# Patient Record
Sex: Female | Born: 1970
Health system: Southern US, Community
[De-identification: ages and names within clinical notes are randomized; demographics above are authoritative.]

## PROBLEM LIST (undated history)

## (undated) DIAGNOSIS — K219 Gastro-esophageal reflux disease without esophagitis: Secondary | ICD-10-CM

## (undated) DIAGNOSIS — Z5189 Encounter for other specified aftercare: Secondary | ICD-10-CM

## (undated) DIAGNOSIS — E119 Type 2 diabetes mellitus without complications: Secondary | ICD-10-CM

## (undated) DIAGNOSIS — E785 Hyperlipidemia, unspecified: Secondary | ICD-10-CM

## (undated) HISTORY — PX: ABDOMINAL HYSTERECTOMY: SHX81

## (undated) HISTORY — DX: Hyperlipidemia, unspecified: E78.5

## (undated) HISTORY — DX: Type 2 diabetes mellitus without complications: E11.9

## (undated) HISTORY — DX: Encounter for other specified aftercare: Z51.89

## (undated) HISTORY — PX: UTERINE FIBROID SURGERY: SHX826

## (undated) HISTORY — DX: Gastro-esophageal reflux disease without esophagitis: K21.9

## (undated) HISTORY — PX: COLONOSCOPY: SHX174

---

## 2018-10-19 ENCOUNTER — Observation Stay (HOSPITAL_COMMUNITY)
Admission: EM | Admit: 2018-10-19 | Discharge: 2018-10-21 | Disposition: A | Discharge status: Home / Self Care | Payer: Self-pay | Attending: General Surgery | Admitting: General Surgery

## 2018-10-19 ENCOUNTER — Encounter (HOSPITAL_COMMUNITY): Payer: Self-pay | Admitting: *Deleted

## 2018-10-19 ENCOUNTER — Other Ambulatory Visit: Payer: Self-pay

## 2018-10-19 DIAGNOSIS — K8010 Calculus of gallbladder with chronic cholecystitis without obstruction: Secondary | ICD-10-CM | POA: Insufficient documentation

## 2018-10-19 DIAGNOSIS — K8050 Calculus of bile duct without cholangitis or cholecystitis without obstruction: Secondary | ICD-10-CM

## 2018-10-19 DIAGNOSIS — Z23 Encounter for immunization: Secondary | ICD-10-CM | POA: Insufficient documentation

## 2018-10-19 DIAGNOSIS — R109 Unspecified abdominal pain: Principal | ICD-10-CM | POA: Insufficient documentation

## 2018-10-19 DIAGNOSIS — K819 Cholecystitis, unspecified: Secondary | ICD-10-CM | POA: Diagnosis present

## 2018-10-19 DIAGNOSIS — Z1159 Encounter for screening for other viral diseases: Secondary | ICD-10-CM | POA: Insufficient documentation

## 2018-10-19 LAB — CBC
HCT: 42.6 % (ref 36.0–46.0)
Hemoglobin: 14.3 g/dL (ref 12.0–15.0)
MCH: 29.6 pg (ref 26.0–34.0)
MCHC: 33.6 g/dL (ref 30.0–36.0)
MCV: 88.2 fL (ref 80.0–100.0)
Platelets: 417 10*3/uL — ABNORMAL HIGH (ref 150–400)
RBC: 4.83 MIL/uL (ref 3.87–5.11)
RDW: 13.2 % (ref 11.5–15.5)
WBC: 10.3 10*3/uL (ref 4.0–10.5)
nRBC: 0 % (ref 0.0–0.2)

## 2018-10-19 MED ORDER — SODIUM CHLORIDE 0.9% FLUSH: 3.0000 mL | Freq: Once | INTRAVENOUS | Status: DC

## 2018-10-19 NOTE — ED Triage Notes (Signed)
Pt reports having right upper abd pain since 0400 that radiates to her back. Has n/v. Denies diarrhea.

## 2018-10-20 ENCOUNTER — Observation Stay (HOSPITAL_COMMUNITY): Payer: Self-pay | Admitting: Certified Registered Nurse Anesthetist

## 2018-10-20 ENCOUNTER — Encounter (HOSPITAL_COMMUNITY)
Admission: EM | Disposition: A | Discharge status: Home / Self Care | Payer: Self-pay | Source: Home / Self Care | Attending: Emergency Medicine

## 2018-10-20 ENCOUNTER — Emergency Department (HOSPITAL_COMMUNITY): Payer: Self-pay

## 2018-10-20 ENCOUNTER — Encounter (HOSPITAL_COMMUNITY): Payer: Self-pay | Admitting: General Practice

## 2018-10-20 DIAGNOSIS — K819 Cholecystitis, unspecified: Secondary | ICD-10-CM | POA: Diagnosis present

## 2018-10-20 HISTORY — PX: CHOLECYSTECTOMY: SHX55

## 2018-10-20 LAB — URINALYSIS, ROUTINE W REFLEX MICROSCOPIC
Bilirubin Urine: NEGATIVE
Glucose, UA: NEGATIVE mg/dL
Ketones, ur: NEGATIVE mg/dL
Leukocytes,Ua: NEGATIVE
Nitrite: NEGATIVE
Protein, ur: NEGATIVE mg/dL
Specific Gravity, Urine: 1.026 (ref 1.005–1.030)
pH: 5 (ref 5.0–8.0)

## 2018-10-20 LAB — COMPREHENSIVE METABOLIC PANEL
ALT: 26 U/L (ref 0–44)
AST: 22 U/L (ref 15–41)
Albumin: 4 g/dL (ref 3.5–5.0)
Alkaline Phosphatase: 73 U/L (ref 38–126)
Anion gap: 13 (ref 5–15)
BUN: 8 mg/dL (ref 6–20)
CO2: 23 mmol/L (ref 22–32)
Calcium: 9.8 mg/dL (ref 8.9–10.3)
Chloride: 101 mmol/L (ref 98–111)
Creatinine, Ser: 0.87 mg/dL (ref 0.44–1.00)
GFR calc Af Amer: >60 mL/min (ref >60)
GFR calc non Af Amer: >60 mL/min (ref >60)
Glucose, Bld: 153 mg/dL — ABNORMAL HIGH (ref 70–99)
Potassium: 3.5 mmol/L (ref 3.5–5.1)
Sodium: 137 mmol/L (ref 135–145)
Total Bilirubin: 0.6 mg/dL (ref 0.3–1.2)
Total Protein: 7.9 g/dL (ref 6.5–8.1)

## 2018-10-20 LAB — SARS CORONAVIRUS 2 BY RT PCR (HOSPITAL ORDER, PERFORMED IN ~~LOC~~ HOSPITAL LAB): SARS Coronavirus 2: NEGATIVE

## 2018-10-20 LAB — LIPASE, BLOOD: Lipase: 31 U/L (ref 11–51)

## 2018-10-20 LAB — SURGICAL PCR SCREEN
MRSA, PCR: NEGATIVE
Staphylococcus aureus: NEGATIVE

## 2018-10-20 SURGERY — LAPAROSCOPIC CHOLECYSTECTOMY
Anesthesia: General | Site: Abdomen

## 2018-10-20 MED ORDER — LACTATED RINGERS IV SOLN
INTRAVENOUS | Status: DC
  Administered 2018-10-20: 11:00:00 via INTRAVENOUS

## 2018-10-20 MED ORDER — FENTANYL CITRATE (PF) 100 MCG/2ML IJ SOLN
50.0000 ug | Freq: Once | INTRAMUSCULAR | Status: AC
  Administered 2018-10-20: 50 ug via INTRAVENOUS
  Filled 2018-10-20: qty 2

## 2018-10-20 MED ORDER — MIDAZOLAM HCL 5 MG/5ML IJ SOLN
INTRAMUSCULAR | Status: DC | PRN
  Administered 2018-10-20: 2 mg via INTRAVENOUS

## 2018-10-20 MED ORDER — ROCURONIUM BROMIDE 10 MG/ML (PF) SYRINGE
PREFILLED_SYRINGE | INTRAVENOUS | Status: DC | PRN
  Administered 2018-10-20: 50 mg via INTRAVENOUS

## 2018-10-20 MED ORDER — LIDOCAINE 2% (20 MG/ML) 5 ML SYRINGE
INTRAMUSCULAR | Status: AC
  Filled 2018-10-20: qty 5

## 2018-10-20 MED ORDER — ROCURONIUM BROMIDE 10 MG/ML (PF) SYRINGE
PREFILLED_SYRINGE | INTRAVENOUS | Status: AC
  Filled 2018-10-20: qty 10

## 2018-10-20 MED ORDER — SUCCINYLCHOLINE CHLORIDE 20 MG/ML IJ SOLN
INTRAMUSCULAR | Status: DC | PRN
  Administered 2018-10-20: 120 mg via INTRAVENOUS

## 2018-10-20 MED ORDER — DOCUSATE SODIUM 100 MG PO CAPS
100.0000 mg | ORAL_CAPSULE | Freq: Two times a day (BID) | ORAL | Status: DC
  Administered 2018-10-20 – 2018-10-21 (×3): 100 mg via ORAL
  Filled 2018-10-20 (×3): qty 1

## 2018-10-20 MED ORDER — BUPIVACAINE-EPINEPHRINE 0.25% -1:200000 IJ SOLN
INTRAMUSCULAR | Status: DC | PRN
  Administered 2018-10-20: 30 mL

## 2018-10-20 MED ORDER — PROPOFOL 10 MG/ML IV BOLUS
INTRAVENOUS | Status: AC
  Filled 2018-10-20: qty 20

## 2018-10-20 MED ORDER — DEXAMETHASONE SODIUM PHOSPHATE 10 MG/ML IJ SOLN
INTRAMUSCULAR | Status: DC | PRN
  Administered 2018-10-20: 10 mg via INTRAVENOUS

## 2018-10-20 MED ORDER — MIDAZOLAM HCL 2 MG/2ML IJ SOLN
INTRAMUSCULAR | Status: AC
  Filled 2018-10-20: qty 2

## 2018-10-20 MED ORDER — DIPHENHYDRAMINE HCL 25 MG PO CAPS: 25.0000 mg | ORAL_CAPSULE | Freq: Four times a day (QID) | ORAL | Status: DC | PRN

## 2018-10-20 MED ORDER — LACTATED RINGERS IV BOLUS
1000.0000 mL | Freq: Once | INTRAVENOUS | Status: AC
  Administered 2018-10-20: 04:00:00 1000 mL via INTRAVENOUS

## 2018-10-20 MED ORDER — BUPIVACAINE-EPINEPHRINE (PF) 0.25% -1:200000 IJ SOLN
INTRAMUSCULAR | Status: AC
  Filled 2018-10-20: qty 30

## 2018-10-20 MED ORDER — SODIUM CHLORIDE 0.9 % IV SOLN: INTRAVENOUS | Status: DC

## 2018-10-20 MED ORDER — KETAMINE HCL 50 MG/5ML IJ SOSY
PREFILLED_SYRINGE | INTRAMUSCULAR | Status: AC
  Filled 2018-10-20: qty 5

## 2018-10-20 MED ORDER — DIPHENHYDRAMINE HCL 50 MG/ML IJ SOLN: 25.0000 mg | Freq: Four times a day (QID) | INTRAMUSCULAR | Status: DC | PRN

## 2018-10-20 MED ORDER — TRAMADOL HCL 50 MG PO TABS: 50.0000 mg | ORAL_TABLET | Freq: Four times a day (QID) | ORAL | Status: DC | PRN

## 2018-10-20 MED ORDER — FENTANYL CITRATE (PF) 250 MCG/5ML IJ SOLN
INTRAMUSCULAR | Status: DC | PRN
  Administered 2018-10-20: 100 ug via INTRAVENOUS
  Administered 2018-10-20: 50 ug via INTRAVENOUS

## 2018-10-20 MED ORDER — OXYCODONE HCL 5 MG PO TABS
5.0000 mg | ORAL_TABLET | Freq: Four times a day (QID) | ORAL | Status: DC | PRN
  Administered 2018-10-20: 5 mg via ORAL
  Filled 2018-10-20: qty 1

## 2018-10-20 MED ORDER — ENOXAPARIN SODIUM 40 MG/0.4ML ~~LOC~~ SOLN
40.0000 mg | SUBCUTANEOUS | Status: DC
  Administered 2018-10-20: 40 mg via SUBCUTANEOUS
  Filled 2018-10-20: qty 0.4

## 2018-10-20 MED ORDER — KETOROLAC TROMETHAMINE 15 MG/ML IJ SOLN
15.0000 mg | Freq: Four times a day (QID) | INTRAMUSCULAR | Status: DC | PRN
  Administered 2018-10-20: 15 mg via INTRAVENOUS
  Filled 2018-10-20: qty 1

## 2018-10-20 MED ORDER — PNEUMOCOCCAL VAC POLYVALENT 25 MCG/0.5ML IJ INJ
0.5000 mL | INJECTION | INTRAMUSCULAR | Status: AC
  Administered 2018-10-21: 0.5 mL via INTRAMUSCULAR
  Filled 2018-10-20: qty 0.5

## 2018-10-20 MED ORDER — FENTANYL CITRATE (PF) 250 MCG/5ML IJ SOLN
INTRAMUSCULAR | Status: AC
  Filled 2018-10-20: qty 5

## 2018-10-20 MED ORDER — METOPROLOL TARTRATE 5 MG/5ML IV SOLN: 5.0000 mg | Freq: Four times a day (QID) | INTRAVENOUS | Status: DC | PRN

## 2018-10-20 MED ORDER — METHOCARBAMOL 500 MG PO TABS
500.0000 mg | ORAL_TABLET | Freq: Four times a day (QID) | ORAL | Status: DC | PRN
  Administered 2018-10-20: 21:00:00 500 mg via ORAL
  Filled 2018-10-20: qty 1

## 2018-10-20 MED ORDER — ACETAMINOPHEN 500 MG PO TABS
1000.0000 mg | ORAL_TABLET | Freq: Four times a day (QID) | ORAL | Status: DC
  Administered 2018-10-20 – 2018-10-21 (×4): 1000 mg via ORAL
  Filled 2018-10-20 (×4): qty 2

## 2018-10-20 MED ORDER — SODIUM CHLORIDE 0.9 % IV SOLN
2.0000 g | INTRAVENOUS | Status: DC
  Administered 2018-10-20 – 2018-10-21 (×2): 2 g via INTRAVENOUS
  Filled 2018-10-20: qty 20
  Filled 2018-10-20: qty 2
  Filled 2018-10-20: qty 20

## 2018-10-20 MED ORDER — HYDRALAZINE HCL 20 MG/ML IJ SOLN: 10.0000 mg | INTRAMUSCULAR | Status: DC | PRN

## 2018-10-20 MED ORDER — ONDANSETRON HCL 4 MG/2ML IJ SOLN
4.0000 mg | Freq: Once | INTRAMUSCULAR | Status: AC
  Administered 2018-10-20: 4 mg via INTRAVENOUS
  Filled 2018-10-20: qty 2

## 2018-10-20 MED ORDER — LIDOCAINE 2% (20 MG/ML) 5 ML SYRINGE
INTRAMUSCULAR | Status: DC | PRN
  Administered 2018-10-20: 40 mg via INTRAVENOUS
  Administered 2018-10-20: 60 mg via INTRAVENOUS

## 2018-10-20 MED ORDER — DEXAMETHASONE SODIUM PHOSPHATE 10 MG/ML IJ SOLN
INTRAMUSCULAR | Status: AC
  Filled 2018-10-20: qty 1

## 2018-10-20 MED ORDER — ONDANSETRON HCL 4 MG/2ML IJ SOLN
INTRAMUSCULAR | Status: DC | PRN
  Administered 2018-10-20: 4 mg via INTRAVENOUS

## 2018-10-20 MED ORDER — SUGAMMADEX SODIUM 200 MG/2ML IV SOLN
INTRAVENOUS | Status: DC | PRN
  Administered 2018-10-20: 200 mg via INTRAVENOUS

## 2018-10-20 MED ORDER — IOHEXOL 300 MG/ML  SOLN
100.0000 mL | Freq: Once | INTRAMUSCULAR | Status: AC | PRN
  Administered 2018-10-20: 100 mL via INTRAVENOUS

## 2018-10-20 MED ORDER — ESMOLOL HCL 100 MG/10ML IV SOLN
INTRAVENOUS | Status: DC | PRN
  Administered 2018-10-20 (×3): 20 mg via INTRAVENOUS
  Administered 2018-10-20: 40 mg via INTRAVENOUS

## 2018-10-20 MED ORDER — ONDANSETRON HCL 4 MG/2ML IJ SOLN: 4.0000 mg | Freq: Four times a day (QID) | INTRAMUSCULAR | Status: DC | PRN

## 2018-10-20 MED ORDER — KETAMINE HCL 10 MG/ML IJ SOLN
INTRAMUSCULAR | Status: DC | PRN
  Administered 2018-10-20: 10 mg via INTRAVENOUS
  Administered 2018-10-20: 20 mg via INTRAVENOUS

## 2018-10-20 MED ORDER — PHENYLEPHRINE 40 MCG/ML (10ML) SYRINGE FOR IV PUSH (FOR BLOOD PRESSURE SUPPORT)
PREFILLED_SYRINGE | INTRAVENOUS | Status: DC | PRN
  Administered 2018-10-20 (×3): 80 ug via INTRAVENOUS

## 2018-10-20 MED ORDER — 0.9 % SODIUM CHLORIDE (POUR BTL) OPTIME
TOPICAL | Status: DC | PRN
  Administered 2018-10-20: 1000 mL

## 2018-10-20 MED ORDER — ONDANSETRON 4 MG PO TBDP: 4.0000 mg | ORAL_TABLET | Freq: Four times a day (QID) | ORAL | Status: DC | PRN

## 2018-10-20 MED ORDER — ONDANSETRON HCL 4 MG/2ML IJ SOLN
INTRAMUSCULAR | Status: AC
  Filled 2018-10-20: qty 2

## 2018-10-20 MED ORDER — PROPOFOL 10 MG/ML IV BOLUS
INTRAVENOUS | Status: DC | PRN
  Administered 2018-10-20: 150 mg via INTRAVENOUS

## 2018-10-20 MED ORDER — HYDROMORPHONE HCL 1 MG/ML IJ SOLN: 0.5000 mg | INTRAMUSCULAR | Status: DC | PRN

## 2018-10-20 SURGICAL SUPPLY — 37 items
BLADE CLIPPER SURG (BLADE) IMPLANT
CANISTER SUCT 3000ML PPV (MISCELLANEOUS) ×2 IMPLANT
CHLORAPREP W/TINT 26 (MISCELLANEOUS) ×2 IMPLANT
CLIP VESOLOCK MED LG 6/CT (CLIP) ×2 IMPLANT
CLIP VESOLOCK XL 6/CT (CLIP) IMPLANT
COVER SURGICAL LIGHT HANDLE (MISCELLANEOUS) ×2 IMPLANT
COVER WAND RF STERILE (DRAPES) ×2 IMPLANT
DERMABOND ADVANCED (GAUZE/BANDAGES/DRESSINGS) ×1
DERMABOND ADVANCED .7 DNX12 (GAUZE/BANDAGES/DRESSINGS) ×1 IMPLANT
ELECT REM PT RETURN 9FT ADLT (ELECTROSURGICAL) ×2
ELECTRODE REM PT RTRN 9FT ADLT (ELECTROSURGICAL) ×1 IMPLANT
GLOVE BIOGEL PI IND STRL 7.0 (GLOVE) ×1 IMPLANT
GLOVE BIOGEL PI INDICATOR 7.0 (GLOVE) ×1
GLOVE SURG SS PI 7.0 STRL IVOR (GLOVE) ×2 IMPLANT
GOWN STRL REUS W/ TWL LRG LVL3 (GOWN DISPOSABLE) ×3 IMPLANT
GOWN STRL REUS W/TWL LRG LVL3 (GOWN DISPOSABLE) ×3
GRASPER SUT TROCAR 14GX15 (MISCELLANEOUS) ×2 IMPLANT
KIT BASIN OR (CUSTOM PROCEDURE TRAY) ×2 IMPLANT
KIT TURNOVER KIT B (KITS) ×2 IMPLANT
NEEDLE 22X1 1/2 (OR ONLY) (NEEDLE) ×2 IMPLANT
NS IRRIG 1000ML POUR BTL (IV SOLUTION) ×2 IMPLANT
PAD ARMBOARD 7.5X6 YLW CONV (MISCELLANEOUS) ×2 IMPLANT
POUCH RETRIEVAL ECOSAC 10 (ENDOMECHANICALS) ×1 IMPLANT
POUCH RETRIEVAL ECOSAC 10MM (ENDOMECHANICALS) ×1
SCISSORS LAP 5X35 DISP (ENDOMECHANICALS) ×2 IMPLANT
SET IRRIG TUBING LAPAROSCOPIC (IRRIGATION / IRRIGATOR) ×2 IMPLANT
SET TUBE SMOKE EVAC HIGH FLOW (TUBING) ×2 IMPLANT
SLEEVE ENDOPATH XCEL 5M (ENDOMECHANICALS) ×4 IMPLANT
SPECIMEN JAR SMALL (MISCELLANEOUS) ×2 IMPLANT
SUT MNCRL AB 4-0 PS2 18 (SUTURE) ×2 IMPLANT
SUT VICRYL 0 UR6 27IN ABS (SUTURE) ×1 IMPLANT
TOWEL GREEN STERILE (TOWEL DISPOSABLE) ×2 IMPLANT
TOWEL GREEN STERILE FF (TOWEL DISPOSABLE) ×2 IMPLANT
TRAY LAPAROSCOPIC MC (CUSTOM PROCEDURE TRAY) ×2 IMPLANT
TROCAR XCEL 12X100 BLDLESS (ENDOMECHANICALS) ×2 IMPLANT
TROCAR XCEL NON-BLD 5MMX100MML (ENDOMECHANICALS) ×2 IMPLANT
WATER STERILE IRR 1000ML POUR (IV SOLUTION) ×2 IMPLANT

## 2018-10-20 NOTE — ED Notes (Signed)
ED TO INPATIENT HANDOFF REPORT  ED Nurse Name and Phone #:  Henderson BaltimoreHannie [phone]  S Name/Age/Gender Rachel FeilMelinda Meza 48 y.o. female Room/Bed: 027C/027C  Code Status   Code Status: Full Code  Home/SNF/Other Home Patient oriented to: self, place, time and situation Is this baseline? Yes   Triage Complete: Triage complete  Chief Complaint Abdominal Pain   Triage Note Pt reports having right upper abd pain since 0400 that radiates to her back. Has n/v. Denies diarrhea.   Allergies No Known Allergies  Level of Care/Admitting Diagnosis ED Disposition    ED Disposition Condition Comment   Admit  [facility] [[id number]]  Level of Care: Med-Surg [16]  Covid Evaluation: Asymptomatic Screening Protocol (No Symptoms)  Diagnosis: Cholecystitis [147829][192631]  Admitting Physician: CCS, MD [[id number]]  Attending Physician: CCS, MD [[id number]]  Bed request comments: 6N  PT Class (Do Not Modify): Observation [104]  PT Acc Code (Do Not Modify): Observation [10022]       B Medical/Surgery History History reviewed. No pertinent past medical history. History reviewed. No pertinent surgical history.   A IV Location/Drains/Wounds Patient Lines/Drains/Airways Status   Active Line/Drains/Airways    Name:   Placement date:   Placement time:   Site:   Days:   Peripheral IV 10/20/18 Left Antecubital   10/20/18    0417    Antecubital   less than 1          Intake/Output Last 24 hours  Intake/Output Summary (Last 24 hours) at 10/20/2018 0716 Last data filed at 10/20/2018 [id number] Gross per 24 hour  Intake 1000 ml  Output -  Net 1000 ml    Labs/Imaging Results for orders placed or performed during the hospital encounter of 10/19/18 (from the past 48 hour(s))  Urinalysis, Routine w reflex microscopic     Status: Abnormal   Collection Time: 10/19/18 11:20 PM  Result Value Ref Range   Color, Urine YELLOW YELLOW   APPearance CLEAR CLEAR   Specific Gravity, Urine 1.026 1.005  - 1.030   pH 5.0 5.0 - 8.0   Glucose, UA NEGATIVE NEGATIVE mg/dL   Hgb urine dipstick MODERATE (A) NEGATIVE   Bilirubin Urine NEGATIVE NEGATIVE   Ketones, ur NEGATIVE NEGATIVE mg/dL   Protein, ur NEGATIVE NEGATIVE mg/dL   Nitrite NEGATIVE NEGATIVE   Leukocytes,Ua NEGATIVE NEGATIVE   RBC / HPF 0-5 0 - 5 RBC/hpf   WBC, UA 0-5 0 - 5 WBC/hpf   Bacteria, UA RARE (A) NONE SEEN   Squamous Epithelial / LPF 0-5 0 - 5   Mucus PRESENT     Comment: Performed at [name], [address] [place], [place] [address]  Lipase, blood     Status: None   Collection Time: 10/19/18 11:31 PM  Result Value Ref Range   Lipase 31 11 - 51 U/L    Comment: Performed at [facility], [address] [place], [place] [address]  Comprehensive metabolic panel     Status: Abnormal   Collection Time: 10/19/18 11:31 PM  Result Value Ref Range   Sodium 137 135 - 145 mmol/L   Potassium 3.5 3.5 - 5.1 mmol/L   Chloride 101 98 - 111 mmol/L   CO2 23 22 - 32 mmol/L   Glucose, Bld 153 (H) 70 - 99 mg/dL   BUN 8 6 - 20 mg/dL   Creatinine, Ser 6.290.87 0.44 - 1.00 mg/dL   Calcium 9.8 8.9 - 52.810.3 mg/dL   Total Protein 7.9  6.5 - 8.1 g/dL   Albumin 4.0 3.5 - 5.0 g/dL   AST 22 15 - 41 U/L   ALT 26 0 - 44 U/L   Alkaline Phosphatase 73 38 - 126 U/L   Total Bilirubin 0.6 0.3 - 1.2 mg/dL   GFR calc non Af Amer >60 >60 mL/min   GFR calc Af Amer >60 >60 mL/min   Anion gap 13 5 - 15    Comment: Performed at [facility] [address], [place], [place] [address]  CBC     Status: Abnormal   Collection Time: 10/19/18 11:31 PM  Result Value Ref Range   WBC 10.3 4.0 - 10.5 K/uL   RBC 4.83 3.87 - 5.11 MIL/uL   Hemoglobin 14.3 12.0 - 15.0 g/dL   HCT 42.6 36.0 - 46.0 %   MCV 88.2 80.0 - 100.0 fL   MCH 29.6 26.0 - 34.0 pg   MCHC 33.6 30.0 - 36.0 g/dL   RDW 13.2 11.5 - 15.5 %   Platelets 417 (H) 150 - 400 K/uL   nRBC 0.0 0.0 - 0.2 %    Comment: Performed at [facility], [place] [address], [place],  [place] [address]   Ct Abdomen Pelvis W Contrast  Result Date: 10/20/2018 CLINICAL DATA:  Abdominal pain with gastroenteritis or colitis suspected. EXAM: CT ABDOMEN AND PELVIS WITH CONTRAST TECHNIQUE: Multidetector CT imaging of the abdomen and pelvis was performed using the standard protocol following bolus administration of intravenous contrast. CONTRAST:  135mL OMNIPAQUE IOHEXOL 300 MG/ML  SOLN COMPARISON:  Right upper quadrant ultrasound earlier today FINDINGS: Lower chest:  No contributory findings. Hepatobiliary: No focal liver abnormality.Cholelithiasis better seen by ultrasound. No superimposed inflammatory changes. Pancreas: Unremarkable. Spleen: Unremarkable. Adrenals/Urinary Tract: Negative adrenals. No hydronephrosis or ureteral stone. 5 mm right renal calculus. Unremarkable bladder. Stomach/Bowel:  No obstruction. No appendicitis. Vascular/Lymphatic: No acute vascular abnormality. No mass or adenopathy. Reproductive:Hysterectomy.  Dominant follicle on the left. Other: No ascites or pneumoperitoneum. Musculoskeletal: No acute abnormalities. IMPRESSION: 1. No acute finding. 2. Cholelithiasis and right nephrolithiasis. Electronically Signed   By: Monte Fantasia M.D.   On: 10/20/2018 04:54   US Abdomen Limited Ruq  Result Date: 10/20/2018 CLINICAL DATA:  48 year old female with right upper quadrant pain radiating to the back. Nausea vomiting. EXAM: ULTRASOUND ABDOMEN LIMITED RIGHT UPPER QUADRANT COMPARISON:  None. FINDINGS: Gallbladder: Echogenic round shadowing and nonshadowing gallstones, individually estimated up to 15 millimeters diameter. Gallbladder wall thickness remains normal. No pericholecystic fluid. No sonographic Murphy sign elicited. Common bile duct: Diameter: 3-5 millimeters, within normal limits. Liver: Echogenic liver (image 36). No intrahepatic biliary ductal dilatation. No discrete liver lesion. Portal vein is patent on color Doppler imaging with normal direction of blood flow towards  the liver. Other findings: Negative visible right kidney.  No free fluid. IMPRESSION: 1. Positive for cholelithiasis, but no evidence of acute cholecystitis or bile duct obstruction. 2. Hepatic steatosis. Electronically Signed   By: Genevie Ann M.D.   On: 10/20/2018 03:26    Pending Labs Unresulted Labs (From admission, onward)    Start     Ordered   10/20/18 9485  HIV antibody (Routine Testing)  Once,   STAT     10/20/18 0639   10/20/18 0609  SARS Coronavirus 2 (CEPHEID - Performed in [facility] hospital lab), [place]  (Asymptomatic Patients Labs)  Once,   STAT    Question:  Rule Out  Answer:  Yes   10/20/18 4627  Vitals/Pain Today's Vitals   10/20/18 0143 10/20/18 0400 10/20/18 0400 10/20/18 0528  BP: (!) 144/91  (!) 144/97   Pulse: 85  85   Resp: 16  16   Temp: 99 F (37.2 C)  98.1 F (36.7 C)   TempSrc: Oral  Oral   SpO2: 96%  100%   PainSc:  7  7  4      Isolation Precautions No active isolations  Medications Medications  sodium chloride flush (NS) 0.9 % injection 3 mL (3 mLs Intravenous Not Given 10/20/18 0416)  enoxaparin (LOVENOX) injection 40 mg (has no administration in time range)  0.9 %  sodium chloride infusion (has no administration in time range)  cefTRIAXone (ROCEPHIN) 2 g in sodium chloride 0.9 % 100 mL IVPB (2 g Intravenous New Bag/Given 10/20/18 0657)  acetaminophen (TYLENOL) tablet 1,000 mg (has no administration in time range)  ketorolac (TORADOL) 15 MG/ML injection 15 mg (has no administration in time range)  traMADol (ULTRAM) tablet 50 mg (has no administration in time range)  oxyCODONE (Oxy IR/ROXICODONE) immediate release tablet 5 mg (has no administration in time range)  HYDROmorphone (DILAUDID) injection 0.5 mg (has no administration in time range)  methocarbamol (ROBAXIN) tablet 500 mg (has no administration in time range)  diphenhydrAMINE (BENADRYL) capsule 25 mg (has no administration in time range)    Or  diphenhydrAMINE (BENADRYL)  injection 25 mg (has no administration in time range)  docusate sodium (COLACE) capsule 100 mg (has no administration in time range)  ondansetron (ZOFRAN-ODT) disintegrating tablet 4 mg (has no administration in time range)    Or  ondansetron (ZOFRAN) injection 4 mg (has no administration in time range)  metoprolol tartrate (LOPRESSOR) injection 5 mg (has no administration in time range)  hydrALAZINE (APRESOLINE) injection 10 mg (has no administration in time range)  ondansetron (ZOFRAN) injection 4 mg (4 mg Intravenous Given 10/20/18 0420)  fentaNYL (SUBLIMAZE) injection 50 mcg (50 mcg Intravenous Given 10/20/18 0419)  lactated ringers bolus 1,000 mL (0 mLs Intravenous Stopped 10/20/18 0616)  iohexol (OMNIPAQUE) 300 MG/ML solution 100 mL (100 mLs Intravenous Contrast Given 10/20/18 0427)  fentaNYL (SUBLIMAZE) injection 50 mcg (50 mcg Intravenous Given 10/20/18 [id number])    Mobility walks Low fall risk   Focused Assessments   R Recommendations: See Admitting Provider Note  Report given to:   Additional Notes:

## 2018-10-20 NOTE — Anesthesia Procedure Notes (Signed)
Procedure Name: Intubation Date/Time: 10/20/2018 12:01 PM Performed by: Milford Cage, CRNA Pre-anesthesia Checklist: Patient identified, Emergency Drugs available, Suction available and Patient being monitored Patient Re-evaluated:Patient Re-evaluated prior to induction Oxygen Delivery Method: Circle system utilized Preoxygenation: Pre-oxygenation with 100% oxygen Induction Type: IV induction Laryngoscope Size: Mac and 3 Grade View: Grade III Tube type: Oral Tube size: 7.0 mm Number of attempts: 1 Airway Equipment and Method: Stylet and Oral airway Placement Confirmation: ETT inserted through vocal cords under direct vision,  positive ETCO2 and breath sounds checked- equal and bilateral Secured at: 22 cm Tube secured with: Tape Dental Injury: Teeth and Oropharynx as per pre-operative assessment

## 2018-10-20 NOTE — H&P (Signed)
Surgical H&P  CC: abdominal pain, nausea  HPI: 48yo otherwise healthy woman presented to the ER with right upper quadrant abdominal pain. Associated nausea/emesis. Symptoms aggravated by PO intake. Began about 36h prior to presentation. Had a similar episode about 8 years ago which lasted a few days and did require ER treatment. Denies fever. Symptoms persist despite analgesic/antiemetic tx in ER.   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 No pertinent past medical history.  History reviewed. No pertinent surgical history.  History reviewed. No pertinent family history.  Social History   Socioeconomic History  . Marital status: Single    Spouse name: Not on file  . Number of children: Not on file  . Years of education: Not on file  . Highest education level: Not on file  Occupational History  . Not on file  Social Needs  . Financial resource strain: Not on file  . Food insecurity    Worry: Not on file    Inability: Not on file  . Transportation needs    Medical: Not on file    Non-medical: Not on file  Tobacco Use  . Smoking status: Never Smoker  Substance and Sexual Activity  . Alcohol use: Not on file  . Drug use: Not on file  . Sexual activity: Not on file  Lifestyle  . Physical activity    Days per week: Not on file    Minutes per session: Not on file  . Stress: Not on file  Relationships  . Social Herbalist on phone: Not on file    Gets together: Not on file    Attends religious service: Not on file    Active member of club or organization: Not on file    Attends meetings of clubs or organizations: Not on file    Relationship status: Not on file  Other Topics Concern  . Not on file  Social History Narrative  . Not on file    No current facility-administered medications on file prior to encounter.    No current outpatient medications on file prior to  encounter.    Review of Systems: a complete, 10pt review of systems was completed with pertinent positives and negatives as documented in the HPI  Physical Exam: Vitals:   10/20/18 0143 10/20/18 0400  BP: (!) 144/91 (!) 144/97  Pulse: 85 85  Resp: 16 16  Temp: 99 F (37.2 C) 98.1 F (36.7 C)  SpO2: 96% 100%   Gen: A&Ox3, no distress  Head: normocephalic, atraumatic Eyes: extraocular motions intact, anicteric.  Neck: supple without mass or thyromegaly Chest: unlabored respirations, symmetrical air entry, clear bilaterally   Cardiovascular: RRR with palpable distal pulses, no pedal edema Abdomen: soft, nondistended, tender in RUQ, no peritoneal signs. No mass or organomegaly.  Extremities: warm, without edema, no deformities  Neuro: grossly intact Psych: appropriate mood and affect, normal insight  Skin: warm and dry   CBC Latest Ref Rng & Units 10/19/2018  WBC 4.0 - 10.5 K/uL 10.3  Hemoglobin 12.0 - 15.0 g/dL 14.3  Hematocrit 36.0 - 46.0 % 42.6  Platelets 150 - 400 K/uL 417(H)    CMP Latest Ref Rng & Units 10/19/2018  Glucose 70 - 99 mg/dL 153(H)  BUN 6 - 20 mg/dL 8  Creatinine 0.44 - 1.00 mg/dL 0.87  Sodium 135 - 145 mmol/L 137  Potassium 3.5 - 5.1 mmol/L 3.5  Chloride 98 -  111 mmol/L 101  CO2 22 - 32 mmol/L 23  Calcium 8.9 - 10.3 mg/dL 9.8  Total Protein 6.5 - 8.1 g/dL 7.9  Total Bilirubin 0.3 - 1.2 mg/dL 0.6  Alkaline Phos 38 - 126 U/L 73  AST 15 - 41 U/L 22  ALT 0 - 44 U/L 26    No results found for: INR, PROTIME  Imaging: Ct Abdomen Pelvis W Contrast  Result Date: 10/20/2018 CLINICAL DATA:  Abdominal pain with gastroenteritis or colitis suspected. EXAM: CT ABDOMEN AND PELVIS WITH CONTRAST TECHNIQUE: Multidetector CT imaging of the abdomen and pelvis was performed using the standard protocol following bolus administration of intravenous contrast. CONTRAST:  100mL OMNIPAQUE IOHEXOL 300 MG/ML  SOLN COMPARISON:  Right upper quadrant ultrasound earlier today  FINDINGS: Lower chest:  No contributory findings. Hepatobiliary: No focal liver abnormality.Cholelithiasis better seen by ultrasound. No superimposed inflammatory changes. Pancreas: Unremarkable. Spleen: Unremarkable. Adrenals/Urinary Tract: Negative adrenals. No hydronephrosis or ureteral stone. 5 mm right renal calculus. Unremarkable bladder. Stomach/Bowel:  No obstruction. No appendicitis. Vascular/Lymphatic: No acute vascular abnormality. No mass or adenopathy. Reproductive:Hysterectomy.  Dominant follicle on the left. Other: No ascites or pneumoperitoneum. Musculoskeletal: No acute abnormalities. IMPRESSION: 1. No acute finding. 2. Cholelithiasis and right nephrolithiasis. Electronically Signed   By: Marnee SpringJonathon  Watts M.D.   On: 10/20/2018 04:54   [place] Abdomen Limited Ruq  Result Date: 10/20/2018 CLINICAL DATA:  48 year old female with right upper quadrant pain radiating to the back. Nausea vomiting. EXAM: ULTRASOUND ABDOMEN LIMITED RIGHT UPPER QUADRANT COMPARISON:  None. FINDINGS: Gallbladder: Echogenic round shadowing and nonshadowing gallstones, individually estimated up to 15 millimeters diameter. Gallbladder wall thickness remains normal. No pericholecystic fluid. No sonographic Murphy sign elicited. Common bile duct: Diameter: 3-5 millimeters, within normal limits. Liver: Echogenic liver (image 36). No intrahepatic biliary ductal dilatation. No discrete liver lesion. Portal vein is patent on color Doppler imaging with normal direction of blood flow towards the liver. Other findings: Negative visible right kidney.  No free fluid. IMPRESSION: 1. Positive for cholelithiasis, but no evidence of acute cholecystitis or bile duct obstruction. 2. Hepatic steatosis. Electronically Signed   By: Odessa FlemingH  Hall M.D.   On: 10/20/2018 03:26     A/P: Clinically cholecystitis given pain and nausea refractory to medical therapies. I recommend proceeding with laparoscopic cholecystectomy with possible cholangiogram. Discussed  risks of surgery including bleeding, pain, scarring, intraabdominal injury specifically to the common bile duct and sequelae, bile leak, conversion to open surgery, blood clot, pneumonia, heart attack, stroke, failure to resolve symptoms, etc/ Questions welcomed and answered. Plan to proceed to OR today with Dr. Sheliah HatchKinsinger.     Phylliss Blakeshelsea Nakyra Bourn, MD [facility], [place] Pager [phone]

## 2018-10-20 NOTE — ED Provider Notes (Signed)
Emergency Department Provider Note   I have reviewed the triage vital signs and the nursing notes.   HISTORY  Chief Complaint Abdominal Pain   HPI Rachel Meza is a 48 y.o. female who presents to the emergency department today with intermittent right upper quadrant abdominal pain.  Patient states that this is been going on for the last 36 hours.  She states after multiple meals she has had this episode with nausea and then also with vomiting most recently.  No fevers.  No history of the same.  No urinary symptoms.  No history of gallstones or kidney stones that she knows of.  No new alcohol, drugs, tobacco or other changes.   No other associated or modifying symptoms.    History reviewed. No pertinent past medical history.  There are no active problems to display for this patient.   History reviewed. No pertinent surgical history.    Allergies Patient has no known allergies.  History reviewed. No pertinent family history.  Social History Social History   Tobacco Use   Smoking status: Never Smoker  Substance Use Topics   Alcohol use: Not on file   Drug use: Not on file    Review of Systems  All other systems negative except as documented in the HPI. All pertinent positives and negatives as reviewed in the HPI. ____________________________________________   PHYSICAL EXAM:  VITAL SIGNS: ED Triage Vitals  Enc Vitals Group     BP 10/19/18 2307 (!) 159/91     Pulse Rate 10/19/18 2307 97     Resp 10/19/18 2307 18     Temp 10/19/18 2307 98.7 F (37.1 C)     Temp Source 10/19/18 2307 Oral     SpO2 10/19/18 2307 96 %    Constitutional: Alert and oriented. Well appearing and in no acute distress. Eyes: Conjunctivae are normal. PERRL. EOMI. Head: Atraumatic. Nose: No congestion/rhinnorhea. Mouth/Throat: Mucous membranes are moist.  Oropharynx non-erythematous. Neck: No stridor.  No meningeal signs.   Cardiovascular: Normal rate, regular rhythm. Good  peripheral circulation. Grossly normal heart sounds.   Respiratory: Normal respiratory effort.  No retractions. Lungs CTAB. Gastrointestinal: Soft and nontender. No distention.  Musculoskeletal: No lower extremity tenderness nor edema. No gross deformities of extremities. Neurologic:  Normal speech and language. No gross focal neurologic deficits are appreciated.  Skin:  Skin is warm, dry and intact. No rash noted.   ____________________________________________   LABS (all labs ordered are listed, but only abnormal results are displayed)  Labs Reviewed  COMPREHENSIVE METABOLIC PANEL - Abnormal; Notable for the following components:      Result Value   Glucose, Bld 153 (*)    All other components within normal limits  CBC - Abnormal; Notable for the following components:   Platelets 417 (*)    All other components within normal limits  URINALYSIS, ROUTINE W REFLEX MICROSCOPIC - Abnormal; Notable for the following components:   Hgb urine dipstick MODERATE (*)    Bacteria, UA RARE (*)    All other components within normal limits  SARS CORONAVIRUS 2 (HOSPITAL ORDER, PERFORMED IN [facility])  LIPASE, BLOOD   ____________________________________________   RADIOLOGY  Ct Abdomen Pelvis W Contrast  Result Date: 10/20/2018 CLINICAL DATA:  Abdominal pain with gastroenteritis or colitis suspected. EXAM: CT ABDOMEN AND PELVIS WITH CONTRAST TECHNIQUE: Multidetector CT imaging of the abdomen and pelvis was performed using the standard protocol following bolus administration of intravenous contrast. CONTRAST:  100mL OMNIPAQUE IOHEXOL 300 MG/ML  SOLN COMPARISON:  Right upper quadrant ultrasound earlier today FINDINGS: Lower chest:  No contributory findings. Hepatobiliary: No focal liver abnormality.Cholelithiasis better seen by ultrasound. No superimposed inflammatory changes. Pancreas: Unremarkable. Spleen: Unremarkable. Adrenals/Urinary Tract: Negative adrenals. No hydronephrosis or  ureteral stone. 5 mm right renal calculus. Unremarkable bladder. Stomach/Bowel:  No obstruction. No appendicitis. Vascular/Lymphatic: No acute vascular abnormality. No mass or adenopathy. Reproductive:Hysterectomy.  Dominant follicle on the left. Other: No ascites or pneumoperitoneum. Musculoskeletal: No acute abnormalities. IMPRESSION: 1. No acute finding. 2. Cholelithiasis and right nephrolithiasis. Electronically Signed   By: Monte Fantasia M.D.   On: 10/20/2018 04:54   US Abdomen Limited Ruq  Result Date: 10/20/2018 CLINICAL DATA:  48 year old female with right upper quadrant pain radiating to the back. Nausea vomiting. EXAM: ULTRASOUND ABDOMEN LIMITED RIGHT UPPER QUADRANT COMPARISON:  None. FINDINGS: Gallbladder: Echogenic round shadowing and nonshadowing gallstones, individually estimated up to 15 millimeters diameter. Gallbladder wall thickness remains normal. No pericholecystic fluid. No sonographic Murphy sign elicited. Common bile duct: Diameter: 3-5 millimeters, within normal limits. Liver: Echogenic liver (image 36). No intrahepatic biliary ductal dilatation. No discrete liver lesion. Portal vein is patent on color Doppler imaging with normal direction of blood flow towards the liver. Other findings: Negative visible right kidney.  No free fluid. IMPRESSION: 1. Positive for cholelithiasis, but no evidence of acute cholecystitis or bile duct obstruction. 2. Hepatic steatosis. Electronically Signed   By: Genevie Ann M.D.   On: 10/20/2018 03:26    ____________________________________________   PROCEDURES  Procedure(s) performed:   Procedures   ____________________________________________   INITIAL IMPRESSION / ASSESSMENT AND PLAN / ED COURSE  Here with likely biliary colic.  We had her pain much better but then we gave her something to eat and shortly after she started having onset of pain again.  Discussed with surgery who will see for likely cholecystectomy.  Cover test ordered.     Pertinent labs & imaging results that were available during my care of the patient were reviewed by me and considered in my medical decision making (see chart for details).  ____________________________________________  FINAL CLINICAL IMPRESSION(S) / ED DIAGNOSES  Final diagnoses:  Biliary colic     MEDICATIONS GIVEN DURING THIS VISIT:  Medications  sodium chloride flush (NS) 0.9 % injection 3 mL (3 mLs Intravenous Not Given 10/20/18 0416)  fentaNYL (SUBLIMAZE) injection 50 mcg (has no administration in time range)  ondansetron (ZOFRAN) injection 4 mg (4 mg Intravenous Given 10/20/18 0420)  fentaNYL (SUBLIMAZE) injection 50 mcg (50 mcg Intravenous Given 10/20/18 0419)  lactated ringers bolus 1,000 mL (1,000 mLs Intravenous New Bag/Given 10/20/18 0419)  iohexol (OMNIPAQUE) 300 MG/ML solution 100 mL (100 mLs Intravenous Contrast Given 10/20/18 0427)     NEW OUTPATIENT MEDICATIONS STARTED DURING THIS VISIT:  New Prescriptions   No medications on file    Note:  This note was prepared with assistance of Dragon voice recognition software. Occasional wrong-word or sound-a-like substitutions may have occurred due to the inherent limitations of voice recognition software.   Rivers Hamrick, Corene Cornea, MD 10/20/18 [phone]

## 2018-10-20 NOTE — Progress Notes (Signed)
Dr. Ola Spurr, E made aware of NPO status will delay start of case until 1130. OR desk notified.

## 2018-10-20 NOTE — Anesthesia Preprocedure Evaluation (Signed)
Anesthesia Evaluation  Patient identified by MRN, date of birth, ID band Patient awake    Reviewed: Allergy & Precautions, H&P , NPO status , Patient's Chart, lab work & pertinent test results  Airway Mallampati: II  TM Distance: >3 FB Neck ROM: Full    Dental no notable dental hx. (+) Teeth Intact, Dental Advisory Given   Pulmonary neg pulmonary ROS,    Pulmonary exam normal breath sounds clear to auscultation       Cardiovascular negative cardio ROS   Rhythm:Regular Rate:Normal     Neuro/Psych negative neurological ROS  negative psych ROS   GI/Hepatic negative GI ROS, Neg liver ROS,   Endo/Other  negative endocrine ROS  Renal/GU negative Renal ROS  negative genitourinary   Musculoskeletal   Abdominal   Peds  Hematology negative hematology ROS (+)   Anesthesia Other Findings   Reproductive/Obstetrics negative OB ROS                             Anesthesia Physical Anesthesia Plan  ASA: II  Anesthesia Plan: General   Post-op Pain Management:    Induction: Intravenous  PONV Risk Score and Plan: 4 or greater and Ondansetron, Dexamethasone and Midazolam  Airway Management Planned: Oral ETT  Additional Equipment:   Intra-op Plan:   Post-operative Plan: Extubation in OR  Informed Consent: I have reviewed the patients History and Physical, chart, labs and discussed the procedure including the risks, benefits and alternatives for the proposed anesthesia with the patient or authorized representative who has indicated his/her understanding and acceptance.     Dental advisory given  Plan Discussed with: CRNA  Anesthesia Plan Comments:         Anesthesia Quick Evaluation

## 2018-10-20 NOTE — Op Note (Signed)
PATIENT:  Rachel Meza  48 y.o. female  PRE-OPERATIVE DIAGNOSIS:  abdominal pain  POST-OPERATIVE DIAGNOSIS:  abdominal pain  PROCEDURE:  Procedure(s): LAPAROSCOPIC CHOLECYSTECTOMY   SURGEON:  Surgeon(s): Camdin Hegner, Arta Bruce, MD  ASSISTANT: Jackson Latino  ANESTHESIA:   local and general  Indications for procedure: [name] is a 48 y.o. female with symptoms of Abdominal pain and Nausea and vomiting consistent with gallbladder disease, Confirmed by Ultrasound.  Description of procedure: The patient was brought into the operative suite, placed supine. Anesthesia was administered with endotracheal tube. Patient was strapped in place and foot board was secured. All pressure points were offloaded by foam padding. The patient was prepped and draped in the usual sterile fashion.  A small incision was made to the right of the umbilicus. A 57mm trocar was inserted into the peritoneal cavity with optical entry. Pneumoperitoneum was applied with high flow low pressure. 2 43mm trocars were placed in the RUQ. A 18mm trocar was placed in the subxiphoid space. Marcaine was infused to the subxiphoid space and lateral upper right abdomen in the transversus abdominis plane. Next the patient was placed in reverse trendelenberg. The gallbladder was pink in color and distended. A catheter was used to drain the gallbladder for retraction.  The gallbladder was retracted cephalad and lateral. The peritoneum was reflected off the infundibulum working lateral to medial. The cystic duct and cystic artery were identified and further dissection revealed a critical view. The cystic duct and cystic artery were doubly clipped and ligated.   The gallbladder was removed off the liver bed with cautery. The Gallbladder was placed in a specimen bag. The gallbladder fossa was irrigated and hemostasis was applied with cautery. The gallbladder was removed via the 36mm trocar. The fascial defect was closed with interrupted 0  vicryl suture in interrupted fashion. Pneumoperitoneum was removed, all trocar were removed. All incisions were closed with 4-0 monocryl subcuticular stitch. The patient woke from anesthesia and was brought to PACU in stable condition. All counts were correct  Findings: acute cholecystitis with large stones  Specimen: gallbladder  Blood loss: 30 ml  Local anesthesia: 30 ml marcaine  Complications: none  PLAN OF CARE: Admit to inpatient   PATIENT DISPOSITION:  PACU - hemodynamically stable.  Gurney Maxin, M.D. General, Bariatric, & Minimally Invasive Surgery [facility]

## 2018-10-20 NOTE — Plan of Care (Signed)
  Problem: Clinical Measurements: Goal: Ability to maintain clinical measurements within normal limits will improve Outcome: Progressing Goal: Will remain free from infection Outcome: Progressing   Problem: Elimination: Goal: Will not experience complications related to bowel motility Outcome: Progressing Goal: Will not experience complications related to urinary retention Outcome: Progressing   Problem: Pain Managment: Goal: General experience of comfort will improve Outcome: Progressing   

## 2018-10-20 NOTE — Transfer of Care (Signed)
Immediate Anesthesia Transfer of Care Note  Patient: Rachel Meza  Procedure(s) Performed: LAPAROSCOPIC CHOLECYSTECTOMY (N/A Abdomen)  Patient Location: PACU  Anesthesia Type:General  Level of Consciousness: drowsy  Airway & Oxygen Therapy: Patient Spontanous Breathing and Patient connected to face mask oxygen  Post-op Assessment: Report given to RN and Post -op Vital signs reviewed and stable  Post vital signs: Reviewed and stable  Last Vitals:  Vitals Value Taken Time  BP 141/81 10/20/18 1310  Temp    Pulse 83 10/20/18 1311  Resp 19 10/20/18 1311  SpO2 100 % 10/20/18 1311  Vitals shown include unvalidated device data.  Last Pain:  Vitals:   10/20/18 0834  TempSrc:   PainSc: 3          Complications: No apparent anesthesia complications

## 2018-10-20 NOTE — ED Notes (Signed)
Attempted report 

## 2018-10-20 NOTE — Anesthesia Postprocedure Evaluation (Signed)
Anesthesia Post Note  Patient: Rachel Meza  Procedure(s) Performed: LAPAROSCOPIC CHOLECYSTECTOMY (N/A Abdomen)     Patient location during evaluation: PACU Anesthesia Type: General Level of consciousness: awake and alert Pain management: pain level controlled Vital Signs Assessment: post-procedure vital signs reviewed and stable Respiratory status: spontaneous breathing, nonlabored ventilation, respiratory function stable and patient connected to nasal cannula oxygen Cardiovascular status: blood pressure returned to baseline and stable Postop Assessment: no apparent nausea or vomiting Anesthetic complications: no    Last Vitals:  Vitals:   10/20/18 1326 10/20/18 1351  BP: 136/73 125/75  Pulse: 84 84  Resp: 16   Temp:  36.6 C  SpO2: 99% 100%    Last Pain:  Vitals:   10/20/18 1351  TempSrc: Oral  PainSc:                  Wanette Robison S

## 2018-10-21 ENCOUNTER — Encounter (HOSPITAL_COMMUNITY): Payer: Self-pay | Admitting: General Surgery

## 2018-10-21 LAB — HIV ANTIBODY (ROUTINE TESTING W REFLEX): HIV Screen 4th Generation wRfx: NONREACTIVE

## 2018-10-21 MED ORDER — OXYCODONE HCL 5 MG PO TABS: 5.0000 mg | ORAL_TABLET | Freq: Four times a day (QID) | ORAL | 0 refills | Status: DC | PRN

## 2018-10-21 MED ORDER — ACETAMINOPHEN 500 MG PO TABS: 500.0000 mg | ORAL_TABLET | Freq: Four times a day (QID) | ORAL | 0 refills | Status: DC | PRN

## 2018-10-21 MED ORDER — DOCUSATE SODIUM 100 MG PO CAPS: 100.0000 mg | ORAL_CAPSULE | Freq: Two times a day (BID) | ORAL | 0 refills | Status: DC

## 2018-10-21 NOTE — Discharge Instructions (Signed)
[facility] ______[facility] °LAPAROSCOPIC SURGERY: POST OP INSTRUCTIONS °Always review your discharge instruction sheet given to you by the facility where your surgery was performed. °IF YOU HAVE DISABILITY OR FAMILY LEAVE FORMS, YOU MUST BRING THEM TO THE OFFICE FOR PROCESSING.   °DO NOT GIVE THEM TO YOUR DOCTOR. ° °1. A prescription for pain medication may be given to you upon discharge.  Take your pain medication as prescribed, if needed.  If narcotic pain medicine is not needed, then you may take acetaminophen (Tylenol) or ibuprofen (Advil) as needed. °2. Take your usually prescribed medications unless otherwise directed. °3. If you need a refill on your pain medication, please contact your pharmacy.  They will contact our office to request authorization. Prescriptions will not be filled after 5pm or on week-ends. °4. You should follow a light diet the first few days after arrival home, such as soup and crackers, etc.  Be sure to include lots of fluids daily. °5. Most patients will experience some swelling and bruising in the area of the incisions.  Ice packs will help.  Swelling and bruising can take several days to resolve.  °6. It is common to experience some constipation if taking pain medication after surgery.  Increasing fluid intake and taking a stool softener (such as Colace) will usually help or prevent this problem from occurring.  A mild laxative (Milk of Magnesia or Miralax) should be taken according to package instructions if there are no bowel movements after 48 hours. °7. Unless discharge instructions indicate otherwise, you may remove your bandages 24-48 hours after surgery, and you may shower at that time.  You may have steri-strips (small skin tapes) in place directly over the incision.  These strips should be left on the skin for 7-10 days.  If your surgeon used skin glue on the incision, you may shower in 24 hours.  The glue will flake off over the next 2-3 weeks.  Any sutures or  staples will be removed at the office during your follow-up visit. °8. ACTIVITIES:  You may resume regular (light) daily activities beginning the next day--such as daily self-care, walking, climbing stairs--gradually increasing activities as tolerated.  You may have sexual intercourse when it is comfortable.  Refrain from any heavy lifting or straining until approved by your doctor. °a. You may drive when you are no longer taking prescription pain medication, you can comfortably wear a seatbelt, and you can safely maneuver your car and apply brakes. °b. RETURN TO WORK:  __________________________________________________________ °9. You should see your doctor in the office for a follow-up appointment approximately 2-3 weeks after your surgery.  Make sure that you call for this appointment within a day or two after you arrive home to insure a convenient appointment time. °10. OTHER INSTRUCTIONS: __________________________________________________________________________________________________________________________ __________________________________________________________________________________________________________________________ °WHEN TO CALL YOUR DOCTOR: °1. Fever over 101.0 °2. Inability to urinate °3. Continued bleeding from incision. °4. Increased pain, redness, or drainage from the incision. °5. Increasing abdominal pain ° °The clinic staff is available to answer your questions during regular business hours.  Please don’t hesitate to call and ask to speak to one of the nurses for clinical concerns.  If you have a medical emergency, go to the nearest emergency room or call 911.  A surgeon from [facility] is always on call at the hospital. °[address], [address], [place], [place]  [address] ? [address] [address], [place], [place]   [address] °[phone] ? [phone] ? FAX [phone] °Web site:   [email or web address] °

## 2018-10-21 NOTE — Discharge Summary (Signed)
[facility]/Trauma Discharge Summary   Patient ID: Rachel Meza MRN: [id number] DOB/AGE: 1971/03/04 48 y.o.  Admit date: 10/19/2018 Discharge date: 10/21/2018  Admitting Diagnosis: Cholecystitis   Discharge Diagnosis Patient Active Problem List   Diagnosis Date Noted  . Cholecystitis 10/20/2018    Consultants none  Imaging: Ct Abdomen Pelvis W Contrast  Result Date: 10/20/2018 CLINICAL DATA:  Abdominal pain with gastroenteritis or colitis suspected. EXAM: CT ABDOMEN AND PELVIS WITH CONTRAST TECHNIQUE: Multidetector CT imaging of the abdomen and pelvis was performed using the standard protocol following bolus administration of intravenous contrast. CONTRAST:  175mL OMNIPAQUE IOHEXOL 300 MG/ML  SOLN COMPARISON:  Right upper quadrant ultrasound earlier today FINDINGS: Lower chest:  No contributory findings. Hepatobiliary: No focal liver abnormality.Cholelithiasis better seen by ultrasound. No superimposed inflammatory changes. Pancreas: Unremarkable. Spleen: Unremarkable. Adrenals/Urinary Tract: Negative adrenals. No hydronephrosis or ureteral stone. 5 mm right renal calculus. Unremarkable bladder. Stomach/Bowel:  No obstruction. No appendicitis. Vascular/Lymphatic: No acute vascular abnormality. No mass or adenopathy. Reproductive:Hysterectomy.  Dominant follicle on the left. Other: No ascites or pneumoperitoneum. Musculoskeletal: No acute abnormalities. IMPRESSION: 1. No acute finding. 2. Cholelithiasis and right nephrolithiasis. Electronically Signed   By: Monte Fantasia M.D.   On: 10/20/2018 04:54   US Abdomen Limited Ruq  Result Date: 10/20/2018 CLINICAL DATA:  48 year old female with right upper quadrant pain radiating to the back. Nausea vomiting. EXAM: ULTRASOUND ABDOMEN LIMITED RIGHT UPPER QUADRANT COMPARISON:  None. FINDINGS: Gallbladder: Echogenic round shadowing and nonshadowing gallstones, individually estimated up to 15 millimeters diameter. Gallbladder wall  thickness remains normal. No pericholecystic fluid. No sonographic Murphy sign elicited. Common bile duct: Diameter: 3-5 millimeters, within normal limits. Liver: Echogenic liver (image 36). No intrahepatic biliary ductal dilatation. No discrete liver lesion. Portal vein is patent on color Doppler imaging with normal direction of blood flow towards the liver. Other findings: Negative visible right kidney.  No free fluid. IMPRESSION: 1. Positive for cholelithiasis, but no evidence of acute cholecystitis or bile duct obstruction. 2. Hepatic steatosis. Electronically Signed   By: Genevie Ann M.D.   On: 10/20/2018 03:26    Procedures Dr. Kieth Brightly (10/20/18) - Laparoscopic Cholecystectomy   HPI: 48yo otherwise healthy woman presented to the ER with right upper quadrant abdominal pain. Associated nausea/emesis. Symptoms aggravated by PO intake. Began about 36h prior to presentation. Had a similar episode about 8 years ago which lasted a few days and did require ER treatment. Denies fever. Symptoms persist despite analgesic/antiemetic tx in ER.  Reports history of laparoscopic partial hysterectomy Works as a Scientist, water quality at [employer] Course:  Workup showed cholecystitis.  Patient was admitted and underwent procedure listed above.  Tolerated procedure well and was transferred to the floor.  Diet was advanced as tolerated.  On POD#1, the patient was voiding well, tolerating diet, ambulating well, pain well controlled, vital signs stable, incisions c/d/i and felt stable for discharge home.  Patient will follow up as outlined below and knows to call with questions or concerns.     Patient was discharged in good condition.  The [place] Substance controlled database was reviewed prior to prescribing narcotic pain medication to this patient.  Physical Exam: General:  Alert, NAD, pleasant, cooperative Cardio: RRR, S1 & S2 normal, no murmur, rubs, gallops Resp: Effort normal, lungs CTA bilaterally,  no wheezes, rales, rhonchi Abd:  Soft, ND, normal bowel sounds, no tenderness, incisions with glue intact appear well healing without bleeding or drainage. No peritonitis  Skin: warm and dry, no rashes  noted  Allergies as of 10/21/2018   No Known Allergies     Medication List    TAKE these medications   acetaminophen 500 MG tablet Commonly known as: TYLENOL Take 1 tablet (500 mg total) by mouth every 6 (six) hours as needed.   docusate sodium 100 MG capsule Commonly known as: COLACE Take 1 capsule (100 mg total) by mouth 2 (two) times daily.   oxyCODONE 5 MG immediate release tablet Commonly known as: Oxy IR/ROXICODONE Take 1 tablet (5 mg total) by mouth every 6 (six) hours as needed for moderate pain or severe pain.        Follow-up Information     [facility]. Schedule an appointment as soon as possible for a visit.   Contact information: [address] [place] [id number] [phone]       [facility] [place] Call.   Specialty: General Surgery Why: we are working on a follow up appointment for you. Please call our office to see when your appt. Please arrive 20 min prior to complete paperwork and bring your photo ID and insurance card if you have one.  Contact information: [address] [place] [id number] [phone]          Signed: [facility] 10/21/2018, 8:50 AM Pager: [phone] Consults: [phone] Mon-Fri 7:00 am-4:30 pm Sat-Sun 7:00 am-11:30 am

## 2018-10-21 NOTE — Progress Notes (Signed)
Pt discharged home in stable after going over discharge teaching with no concerns voiced

## 2020-02-22 ENCOUNTER — Ambulatory Visit: Payer: Medicaid Other

## 2020-06-21 DIAGNOSIS — Z0000 Encounter for general adult medical examination without abnormal findings: Secondary | ICD-10-CM | POA: Diagnosis not present

## 2020-06-21 DIAGNOSIS — Z131 Encounter for screening for diabetes mellitus: Secondary | ICD-10-CM | POA: Diagnosis not present

## 2020-06-21 DIAGNOSIS — R030 Elevated blood-pressure reading, without diagnosis of hypertension: Secondary | ICD-10-CM | POA: Diagnosis not present

## 2020-06-21 DIAGNOSIS — E781 Pure hyperglyceridemia: Secondary | ICD-10-CM | POA: Diagnosis not present

## 2020-06-21 DIAGNOSIS — E785 Hyperlipidemia, unspecified: Secondary | ICD-10-CM | POA: Diagnosis not present

## 2020-06-21 DIAGNOSIS — Z713 Dietary counseling and surveillance: Secondary | ICD-10-CM | POA: Diagnosis not present

## 2020-06-21 DIAGNOSIS — Z13220 Encounter for screening for lipoid disorders: Secondary | ICD-10-CM | POA: Diagnosis not present

## 2020-06-21 DIAGNOSIS — R739 Hyperglycemia, unspecified: Secondary | ICD-10-CM | POA: Diagnosis not present

## 2020-06-21 DIAGNOSIS — Z0131 Encounter for examination of blood pressure with abnormal findings: Secondary | ICD-10-CM | POA: Diagnosis not present

## 2020-06-21 DIAGNOSIS — Z6835 Body mass index (BMI) 35.0-35.9, adult: Secondary | ICD-10-CM | POA: Diagnosis not present

## 2020-06-27 DIAGNOSIS — R739 Hyperglycemia, unspecified: Secondary | ICD-10-CM | POA: Diagnosis not present

## 2020-06-27 DIAGNOSIS — Z0000 Encounter for general adult medical examination without abnormal findings: Secondary | ICD-10-CM | POA: Diagnosis not present

## 2020-06-27 DIAGNOSIS — E781 Pure hyperglyceridemia: Secondary | ICD-10-CM | POA: Diagnosis not present

## 2020-07-28 DIAGNOSIS — L700 Acne vulgaris: Secondary | ICD-10-CM | POA: Diagnosis not present

## 2020-09-07 DIAGNOSIS — Z23 Encounter for immunization: Secondary | ICD-10-CM | POA: Diagnosis not present

## 2020-10-25 ENCOUNTER — Encounter: Payer: Self-pay | Admitting: Family

## 2020-10-25 ENCOUNTER — Telehealth (INDEPENDENT_AMBULATORY_CARE_PROVIDER_SITE_OTHER): Payer: 59 | Admitting: Family

## 2020-10-25 ENCOUNTER — Other Ambulatory Visit: Payer: Self-pay

## 2020-10-25 DIAGNOSIS — Z7689 Persons encountering health services in other specified circumstances: Secondary | ICD-10-CM

## 2020-10-25 NOTE — Progress Notes (Signed)
Pt presents for telemedicine visit to establish care °

## 2020-10-25 NOTE — Progress Notes (Signed)
Virtual Visit via Telephone Note  I connected with Rachel Meza, on 10/25/2020 at 4:33 PM by telephone due to the COVID-19 pandemic and verified that I am speaking with the correct person using two identifiers.  Due to current restrictions/limitations of in-office visits due to the COVID-19 pandemic, this scheduled clinical appointment was converted to a telehealth visit.   Consent: I discussed the limitations, risks, security and privacy concerns of performing an evaluation and management service by telephone and the availability of in person appointments. I also discussed with the patient that there may be a patient responsible charge related to this service. The patient expressed understanding and agreed to proceed.   Location of Patient: Home  Location of Provider: [facility]   Persons participating in Telemedicine visit: Avrey Flanagin Ricky Stabs, NP Margorie John, CMA   History of Present Illness: Rachel Meza is a 50 year-old female who presents to establish care. PMH significant for cholecystitis.  Current issues and/or concerns:  Would like to have annual physical exam. Had a visit at the [facility] March 2022 and told she has possible high blood pressure and diabetes. Concern for hands and shoulders hurting. Hands are ongoing pain. Shoulders are new onset. Does not prefer to take medications for the same.    History reviewed. No pertinent past medical history. No Known Allergies  Current Outpatient Medications on File Prior to Visit  Medication Sig Dispense Refill   emtricitabine-tenofovir (TRUVADA) 200-300 MG tablet Take 1 tablet by mouth daily.     No current facility-administered medications on file prior to visit.    Observations/Objective: Alert and oriented x 3. Not in acute distress. Physical examination not completed as this is a telemedicine visit.  Assessment and Plan: 1. Encounter to establish care: -  Patient presents today to establish care.  - Return for annual physical examination, labs, and health maintenance. Arrive fasting meaning having no food for at least 8 hours prior to appointment. You may have only water or black coffee. Please take scheduled medications as normal.   Follow Up Instructions: Return for annual physical exam.   Patient was given clear instructions to go to Emergency Department or return to medical center if symptoms don't improve, worsen, or new problems develop.The patient verbalized understanding.  I discussed the assessment and treatment plan with the patient. The patient was provided an opportunity to ask questions and all were answered. The patient agreed with the plan and demonstrated an understanding of the instructions.   The patient was advised to call back or seek an in-person evaluation if the symptoms worsen or if the condition fails to improve as anticipated.    I provided 10 minutes total of non-face-to-face time during this encounter.   Rema Fendt, NP  [facility] at [facility] [place], [place] [phone] 10/25/2020, 4:33 PM

## 2020-12-23 NOTE — Progress Notes (Signed)
Patient ID: Rachel Meza, female    DOB: 12-16-1970  MRN: [id number]  CC: Annual Physical Exam  Subjective: Rachel Meza is a 50 y.o. female who presents for annual physical exam.   Her concerns today include:  Reports body pain is continuing. Primarily bilateral shoulders right > left, bilateral hands, right hip, and right foot. Denies any trauma or injury.  Reports she had baseline blood work obtained in April 2022 at the local [facility] with elevations of hemoglobin A1c and cholesterol.   Patient Active Problem List   Diagnosis Date Noted   Cholecystitis 10/20/2018     Current Outpatient Medications on File Prior to Visit  Medication Sig Dispense Refill   emtricitabine-tenofovir (TRUVADA) 200-300 MG tablet Take 1 tablet by mouth daily.     No current facility-administered medications on file prior to visit.    No Known Allergies  Social History   Socioeconomic History   Marital status: Single    Spouse name: Not on file   Number of children: Not on file   Years of education: Not on file   Highest education level: Not on file  Occupational History   Not on file  Tobacco Use   Smoking status: Every Day    Packs/day: 0.50    Years: 10.00    Pack years: 5.00    Types: Cigarettes   Smokeless tobacco: Never  Vaping Use   Vaping Use: Never used  Substance and Sexual Activity   Alcohol use: Yes    Alcohol/week: 3.0 standard drinks    Types: 3 Standard drinks or equivalent per week    Comment: occassionally   Drug use: Never   Sexual activity: Not on file    Comment: Tubal ligation  Other Topics Concern   Not on file  Social History Narrative   Not on file   Social Determinants of Health   Financial Resource Strain: Not on file  Food Insecurity: Not on file  Transportation Needs: Not on file  Physical Activity: Not on file  Stress: Not on file  Social Connections: Not on file  Intimate Partner Violence: Not on file    No family history on  file.  Past Surgical History:  Procedure Laterality Date   ABDOMINAL HYSTERECTOMY     CHOLECYSTECTOMY  10/20/2018   CHOLECYSTECTOMY N/A 10/20/2018   Procedure: LAPAROSCOPIC CHOLECYSTECTOMY;  Surgeon: Mickeal Skinner, MD;  Location: [facility];  Service: General;  Laterality: N/A;    ROS: Review of Systems Negative except as stated above  PHYSICAL EXAM: BP 123/79 (BP Location: Left Arm, Patient Position: Sitting, Cuff Size: Normal)   Pulse 88   Temp 97.7 F (36.5 C)   Resp 18   Ht 5' 1.02" (1.55 m)   Wt 177 lb 9.6 oz (80.6 kg)   SpO2 97%   BMI 33.53 kg/m   Physical Exam Exam conducted with a chaperone present.  HENT:     Head: Normocephalic and atraumatic.     Right Ear: Tympanic membrane, ear canal and external ear normal.     Left Ear: Tympanic membrane, ear canal and external ear normal.  Eyes:     Extraocular Movements: Extraocular movements intact.     Conjunctiva/sclera: Conjunctivae normal.     Pupils: Pupils are equal, round, and reactive to light.  Cardiovascular:     Rate and Rhythm: Normal rate and regular rhythm.     Pulses: Normal pulses.     Heart sounds: Normal heart sounds.  Pulmonary:  Effort: Pulmonary effort is normal.     Breath sounds: Normal breath sounds.  Chest:     Comments: Exam deferred pending mammogram. No issues/concerns from patient.  Abdominal:     General: Bowel sounds are normal.     Palpations: Abdomen is soft.  Genitourinary:    General: Normal vulva.     Vagina: Normal.     Cervix: Normal.     Uterus: Normal.      Adnexa: Right adnexa normal and left adnexa normal.     Comments: Elmon Else, CMA present during exam. Musculoskeletal:        General: Normal range of motion.     Cervical back: Normal range of motion and neck supple.  Skin:    General: Skin is warm and dry.     Capillary Refill: Capillary refill takes less than 2 seconds.  Neurological:     General: No focal deficit present.     Mental Status: She  is alert and oriented to person, place, and time.  Psychiatric:        Mood and Affect: Mood normal.        Behavior: Behavior normal.    ASSESSMENT AND PLAN: 1. Annual physical exam: - Counseled on 150 minutes of exercise per week as tolerated, healthy eating (including decreased daily intake of saturated fats, cholesterol, added sugars, sodium), STI prevention, and routine healthcare maintenance.  2. Screening for metabolic disorder: - RAQ76+AUQJ to check kidney function, liver function, and electrolyte balance.  - CMP14+EGFR  3. Screening for deficiency anemia: - CBC to screen for anemia. - CBC  4. Diabetes mellitus screening: - Hemoglobin A1c to screen for pre-diabetes/diabetes. - Hemoglobin A1c  5. Screening cholesterol level: - Lipid panel to screen for high cholesterol.  - Lipid panel  6. Thyroid disorder screen: - TSH to check thyroid function.  - TSH  7. Need for hepatitis C screening test: - Hepatitis C antibody to screen for hepatitis C.  - Hepatitis C Antibody  8. Pap smear for cervical cancer screening: - Cytology - PAP for cervical cancer screening.  - Cytology - PAP([facility])  9. Routine screening for STI (sexually transmitted infection): - Cervicovaginal self-swab to screen for chlamydia, gonorrhea, trichomonas, bacterial vaginitis, and candida vaginitis. - Cervicovaginal ancillary only  10. Colon cancer screening: - Referral to Gastroenterology for colon cancer screening by colonoscopy. - Ambulatory referral to Gastroenterology  11. Encounter for screening mammogram for malignant neoplasm of breast: - Referral for breast cancer screening by mammogram.  - MM Digital Screening; Future  12. Generalized body aches: - Screening for sensitivity of possible fibromyalgia.  - ANA - Rheumatoid factor - Sedimentation Rate  13. Chronic pain of both shoulders: - Diagnostic x-ray left shoulder for further evaluation.  - Diagnostic x-ray right shoulder  for further evaluation.  - Follow-up with primary provider as scheduled.  - DG Shoulder Left; Future - DG Shoulder Right; Future  14. Chronic pain of left hand: - Diagnostic x-ray left hand for further evaluation.  - Follow-up with primary provider as scheduled.  - DG Hand Complete Left; Future  15. Chronic hand pain, right: - Diagnostic x-ray right hand for further evaluation.  - Follow-up with primary provider as scheduled.  - DG Hand Complete Right; Future   Patient was given the opportunity to ask questions.  Patient verbalized understanding of the plan and was able to repeat key elements of the plan. Patient was given clear instructions to go to Emergency Department or return to  medical center if symptoms don't improve, worsen, or new problems develop.The patient verbalized understanding.   Orders Placed This Encounter  Procedures   MM Digital Screening   DG Shoulder Left   DG Shoulder Right   DG Hand Complete Left   DG Hand Complete Right   Hepatitis C Antibody   CBC   Lipid panel   CMP14+EGFR   Hemoglobin A1c   ANA   Rheumatoid factor   Sedimentation Rate   TSH   Ambulatory referral to Gastroenterology     Requested Prescriptions    No prescriptions requested or ordered in this encounter    Return in about 1 year (around 12/27/2021) for Physical per patient preference.  Camillia Herter, NP

## 2020-12-27 ENCOUNTER — Ambulatory Visit (INDEPENDENT_AMBULATORY_CARE_PROVIDER_SITE_OTHER): Payer: 59 | Admitting: Family

## 2020-12-27 ENCOUNTER — Other Ambulatory Visit (HOSPITAL_COMMUNITY)
Admission: RE | Admit: 2020-12-27 | Discharge: 2020-12-27 | Disposition: A | Discharge status: Home / Self Care | Payer: 59 | Source: Ambulatory Visit | Attending: Family | Admitting: Family

## 2020-12-27 ENCOUNTER — Other Ambulatory Visit: Payer: Self-pay

## 2020-12-27 VITALS — BP 123/79 | HR 88 | Temp 97.7°F | Resp 18 | Ht 61.02 in | Wt 177.6 lb

## 2020-12-27 DIAGNOSIS — Z1329 Encounter for screening for other suspected endocrine disorder: Secondary | ICD-10-CM

## 2020-12-27 DIAGNOSIS — Z124 Encounter for screening for malignant neoplasm of cervix: Secondary | ICD-10-CM | POA: Diagnosis not present

## 2020-12-27 DIAGNOSIS — Z1211 Encounter for screening for malignant neoplasm of colon: Secondary | ICD-10-CM

## 2020-12-27 DIAGNOSIS — Z13228 Encounter for screening for other metabolic disorders: Secondary | ICD-10-CM | POA: Diagnosis not present

## 2020-12-27 DIAGNOSIS — N771 Vaginitis, vulvitis and vulvovaginitis in diseases classified elsewhere: Secondary | ICD-10-CM | POA: Diagnosis not present

## 2020-12-27 DIAGNOSIS — Z13220 Encounter for screening for lipoid disorders: Secondary | ICD-10-CM | POA: Diagnosis not present

## 2020-12-27 DIAGNOSIS — M25511 Pain in right shoulder: Secondary | ICD-10-CM | POA: Diagnosis not present

## 2020-12-27 DIAGNOSIS — Z0000 Encounter for general adult medical examination without abnormal findings: Secondary | ICD-10-CM | POA: Diagnosis not present

## 2020-12-27 DIAGNOSIS — Z113 Encounter for screening for infections with a predominantly sexual mode of transmission: Secondary | ICD-10-CM

## 2020-12-27 DIAGNOSIS — R52 Pain, unspecified: Secondary | ICD-10-CM | POA: Diagnosis not present

## 2020-12-27 DIAGNOSIS — Z131 Encounter for screening for diabetes mellitus: Secondary | ICD-10-CM | POA: Diagnosis not present

## 2020-12-27 DIAGNOSIS — G8929 Other chronic pain: Secondary | ICD-10-CM | POA: Diagnosis not present

## 2020-12-27 DIAGNOSIS — M25512 Pain in left shoulder: Secondary | ICD-10-CM | POA: Diagnosis not present

## 2020-12-27 DIAGNOSIS — Z130 Encounter for screening for diseases of the blood and blood-forming organs and certain disorders involving the immune mechanism: Secondary | ICD-10-CM

## 2020-12-27 DIAGNOSIS — M79642 Pain in left hand: Secondary | ICD-10-CM

## 2020-12-27 DIAGNOSIS — M79641 Pain in right hand: Secondary | ICD-10-CM | POA: Diagnosis not present

## 2020-12-27 DIAGNOSIS — Z1231 Encounter for screening mammogram for malignant neoplasm of breast: Secondary | ICD-10-CM

## 2020-12-27 DIAGNOSIS — Z118 Encounter for screening for other infectious and parasitic diseases: Secondary | ICD-10-CM | POA: Diagnosis present

## 2020-12-27 DIAGNOSIS — Z1159 Encounter for screening for other viral diseases: Secondary | ICD-10-CM | POA: Diagnosis not present

## 2020-12-27 NOTE — Patient Instructions (Signed)
Preventive Care 40-49 Years Old, Female Preventive care refers to lifestyle choices and visits with your health care provider that can promote health and wellness. This includes: A yearly physical exam. This is also called an annual wellness visit. Regular dental and eye exams. Immunizations. Screening for certain conditions. Healthy lifestyle choices, such as: Eating a healthy diet. Getting regular exercise. Not using drugs or products that contain nicotine and tobacco. Limiting alcohol use. What can I expect for my preventive care visit? Physical exam Your health care provider will check your: Height and weight. These may be used to calculate your BMI (body mass index). BMI is a measurement that tells if you are at a healthy weight. Heart rate and blood pressure. Body temperature. Skin for abnormal spots. Counseling Your health care provider may ask you questions about your: Past medical problems. Family's medical history. Alcohol, tobacco, and drug use. Emotional well-being. Home life and relationship well-being. Sexual activity. Diet, exercise, and sleep habits. Work and work environment. Access to firearms. Method of birth control. Menstrual cycle. Pregnancy history. What immunizations do I need? Vaccines are usually given at various ages, according to a schedule. Your health care provider will recommend vaccines for you based on your age, medical history, and lifestyle or other factors, such as travel or where you work. What tests do I need? Blood tests Lipid and cholesterol levels. These may be checked every 5 years, or more often if you are over 50 years old. Hepatitis C test. Hepatitis B test. Screening Lung cancer screening. You may have this screening every year starting at age 55 if you have a 30-pack-year history of smoking and currently smoke or have quit within the past 15 years. Colorectal cancer screening. All adults should have this screening starting at  age 50 and continuing until age 75. Your health care provider may recommend screening at age 45 if you are at increased risk. You will have tests every 1-10 years, depending on your results and the type of screening test. Diabetes screening. This is done by checking your blood sugar (glucose) after you have not eaten for a while (fasting). You may have this done every 1-3 years. Mammogram. This may be done every 1-2 years. Talk with your health care provider about when you should start having regular mammograms. This may depend on whether you have a family history of breast cancer. BRCA-related cancer screening. This may be done if you have a family history of breast, ovarian, tubal, or peritoneal cancers. Pelvic exam and Pap test. This may be done every 3 years starting at age 21. Starting at age 30, this may be done every 5 years if you have a Pap test in combination with an HPV test. Other tests STD (sexually transmitted disease) testing, if you are at risk. Bone density scan. This is done to screen for osteoporosis. You may have this scan if you are at high risk for osteoporosis. Talk with your health care provider about your test results, treatment options, and if necessary, the need for more tests. Follow these instructions at home: Eating and drinking  Eat a diet that includes fresh fruits and vegetables, whole grains, lean protein, and low-fat dairy products. Take vitamin and mineral supplements as recommended by your health care provider. Do not drink alcohol if: Your health care provider tells you not to drink. You are pregnant, may be pregnant, or are planning to become pregnant. If you drink alcohol: Limit how much you have to 0-1 drink a day. Be   aware of how much alcohol is in your drink. In the [place] one drink equals one 12 oz bottle of beer (355 mL), one 5 oz glass of wine (148 mL), or one 1 oz glass of hard liquor (44 mL). Lifestyle Take daily care of your teeth and  gums. Brush your teeth every morning and night with fluoride toothpaste. Floss one time each day. Stay active. Exercise for at least 30 minutes 5 or more days each week. Do not use any products that contain nicotine or tobacco, such as cigarettes, e-cigarettes, and chewing tobacco. If you need help quitting, ask your health care provider. Do not use drugs. If you are sexually active, practice safe sex. Use a condom or other form of protection to prevent STIs (sexually transmitted infections). If you do not wish to become pregnant, use a form of birth control. If you plan to become pregnant, see your health care provider for a prepregnancy visit. If told by your health care provider, take low-dose aspirin daily starting at age 63. Find healthy ways to cope with stress, such as: Meditation, yoga, or listening to music. Journaling. Talking to a trusted person. Spending time with friends and family. Safety Always wear your seat belt while driving or riding in a vehicle. Do not drive: If you have been drinking alcohol. Do not ride with someone who has been drinking. When you are tired or distracted. While texting. Wear a helmet and other protective equipment during sports activities. If you have firearms in your house, make sure you follow all gun safety procedures. What's next? Visit your health care provider once a year for an annual wellness visit. Ask your health care provider how often you should have your eyes and teeth checked. Stay up to date on all vaccines. This information is not intended to replace advice given to you by your health care provider. Make sure you discuss any questions you have with your health care provider. Document Revised: 06/02/2020 Document Reviewed: 12/04/2017 Elsevier Patient Education  2022 [employer].

## 2020-12-27 NOTE — Progress Notes (Signed)
Pt presents for annual physical exam w/pap desires STD screening, pt reports concerns of joint pain

## 2020-12-28 ENCOUNTER — Other Ambulatory Visit: Payer: Self-pay | Admitting: Family

## 2020-12-28 DIAGNOSIS — R52 Pain, unspecified: Secondary | ICD-10-CM

## 2020-12-28 DIAGNOSIS — E119 Type 2 diabetes mellitus without complications: Secondary | ICD-10-CM

## 2020-12-28 DIAGNOSIS — R700 Elevated erythrocyte sedimentation rate: Secondary | ICD-10-CM

## 2020-12-28 DIAGNOSIS — N760 Acute vaginitis: Secondary | ICD-10-CM | POA: Insufficient documentation

## 2020-12-28 DIAGNOSIS — B9689 Other specified bacterial agents as the cause of diseases classified elsewhere: Secondary | ICD-10-CM

## 2020-12-28 DIAGNOSIS — R768 Other specified abnormal immunological findings in serum: Secondary | ICD-10-CM

## 2020-12-28 LAB — ANA: Anti Nuclear Antibody (ANA): POSITIVE — AB

## 2020-12-28 LAB — CERVICOVAGINAL ANCILLARY ONLY
Bacterial Vaginitis (gardnerella): POSITIVE — AB
Candida Glabrata: NEGATIVE
Candida Vaginitis: NEGATIVE
Chlamydia: NEGATIVE
Comment: NEGATIVE
Comment: NEGATIVE
Comment: NEGATIVE
Comment: NEGATIVE
Comment: NEGATIVE
Comment: NORMAL
Neisseria Gonorrhea: NEGATIVE
Trichomonas: NEGATIVE

## 2020-12-28 LAB — CMP14+EGFR
ALT: 26 IU/L (ref 0–32)
AST: 19 IU/L (ref 0–40)
Albumin/Globulin Ratio: 1.6 (ref 1.2–2.2)
Albumin: 4.6 g/dL (ref 3.8–4.8)
Alkaline Phosphatase: 120 IU/L (ref 44–121)
BUN/Creatinine Ratio: 21 (ref 9–23)
BUN: 13 mg/dL (ref 6–24)
Bilirubin Total: 0.3 mg/dL (ref 0.0–1.2)
CO2: 20 mmol/L (ref 20–29)
Calcium: 9.5 mg/dL (ref 8.7–10.2)
Chloride: 105 mmol/L (ref 96–106)
Creatinine, Ser: 0.63 mg/dL (ref 0.57–1.00)
Globulin, Total: 2.8 g/dL (ref 1.5–4.5)
Glucose: 104 mg/dL — ABNORMAL HIGH (ref 65–99)
Potassium: 4 mmol/L (ref 3.5–5.2)
Sodium: 139 mmol/L (ref 134–144)
Total Protein: 7.4 g/dL (ref 6.0–8.5)
eGFR: 109 mL/min/{1.73_m2} (ref >59)

## 2020-12-28 LAB — CBC
Hematocrit: 39.7 % (ref 34.0–46.6)
Hemoglobin: 13.5 g/dL (ref 11.1–15.9)
MCH: 29.1 pg (ref 26.6–33.0)
MCHC: 34 g/dL (ref 31.5–35.7)
MCV: 86 fL (ref 79–97)
Platelets: 325 10*3/uL (ref 150–450)
RBC: 4.64 x10E6/uL (ref 3.77–5.28)
RDW: 13.1 % (ref 11.7–15.4)
WBC: 7.3 10*3/uL (ref 3.4–10.8)

## 2020-12-28 LAB — LIPID PANEL
Chol/HDL Ratio: 4.3 ratio (ref 0.0–4.4)
Cholesterol, Total: 300 mg/dL — ABNORMAL HIGH (ref 100–199)
HDL: 69 mg/dL (ref >39)
LDL Chol Calc (NIH): 214 mg/dL — ABNORMAL HIGH (ref 0–99)
Triglycerides: 99 mg/dL (ref 0–149)
VLDL Cholesterol Cal: 17 mg/dL (ref 5–40)

## 2020-12-28 LAB — RHEUMATOID FACTOR: Rheumatoid fact SerPl-aCnc: 70.9 IU/mL — ABNORMAL HIGH (ref <14.0)

## 2020-12-28 LAB — HEMOGLOBIN A1C
Est. average glucose Bld gHb Est-mCnc: 148 mg/dL
Hgb A1c MFr Bld: 6.8 % — ABNORMAL HIGH (ref 4.8–5.6)

## 2020-12-28 LAB — SEDIMENTATION RATE: Sed Rate: 56 mm/hr — ABNORMAL HIGH (ref 0–32)

## 2020-12-28 LAB — HEPATITIS C ANTIBODY: Hep C Virus Ab: <0.1 s/co ratio (ref 0.0–0.9)

## 2020-12-28 MED ORDER — METRONIDAZOLE 500 MG PO TABS: 500.0000 mg | ORAL_TABLET | Freq: Two times a day (BID) | ORAL | 0 refills | Status: AC

## 2020-12-28 MED ORDER — METFORMIN HCL 500 MG PO TABS: 500.0000 mg | ORAL_TABLET | Freq: Two times a day (BID) | ORAL | 0 refills | Status: DC

## 2020-12-28 NOTE — Progress Notes (Signed)
Please call patient with update.   Kidney function normal.   Liver function normal.   No anemia.   Hepatitis C negative.   Sedimentation Rate, Rheumatoid Factor, and ANA all higher than normal. This may be related to fibromyalgia. A referral to Rheumatology placed. Their office should call patient within 2 weeks with appointment details.   Hemoglobin A1c is consistent with diabetes. Practice healthy eating habits of fresh fruit and vegetables, lean baked meats such as chicken, fish, and [place]; limit breads, rice, pastas, and desserts; practice regular aerobic exercise (at least 150 minutes a week as tolerated).  Begin Metformin as prescribed for diabetes. Please schedule a follow-up appointment in 4 weeks with primary provider.   Cholesterol higher than expected. High cholesterol may increase risk of heart attack and/or stroke. Consider eating more fruits, vegetables, and lean baked meats such as chicken or fish. Moderate intensity exercise at least 150 minutes as tolerated per week may help as well. However, your risk of heart attack/stroke in ten years is average risk so does not need to start a medication at the moment. Encouraged to recheck cholesterol at lab only visit in 3 months.  Gonorrhea, Chlamydia, Trichomonas, and Candida Vaginitis (sometimes called yeast infection) negative.   Positive for Bacterial Vaginitis, an overgrowth of normal bacteria in the vagina. Prescribed Metronidazole (Flagyl) twice per day for 7 days. Do not drink alcohol while taking this medication.   The following is for provider reference only: The 10-year ASCVD risk score (Arnett DK, et al., 2019) is: 3%   Values used to calculate the score:     Age: 50 years     Sex: Female     Is Non-Hispanic African American: Yes     Diabetic: No     Tobacco smoker: Yes     Systolic Blood Pressure: 123 mmHg     Is BP treated: No     HDL Cholesterol: 69 mg/dL     Total Cholesterol: 300 mg/dL

## 2020-12-29 LAB — CYTOLOGY - PAP
Adequacy: ABSENT
Comment: NEGATIVE
Diagnosis: NEGATIVE
Diagnosis: REACTIVE
High risk HPV: NEGATIVE

## 2020-12-29 NOTE — Progress Notes (Signed)
Please call patient with update.   PAP negative for lesions and malignancy. Repeat PAP in 3 years.   HPV negative.

## 2021-01-02 ENCOUNTER — Encounter: Payer: Self-pay | Admitting: Gastroenterology

## 2021-01-15 ENCOUNTER — Other Ambulatory Visit: Payer: Self-pay

## 2021-01-15 ENCOUNTER — Ambulatory Visit (AMBULATORY_SURGERY_CENTER): Payer: 59 | Admitting: *Deleted

## 2021-01-15 VITALS — Ht 61.0 in | Wt 178.0 lb

## 2021-01-15 DIAGNOSIS — Z1211 Encounter for screening for malignant neoplasm of colon: Secondary | ICD-10-CM

## 2021-01-15 MED ORDER — PLENVU 140 G PO SOLR: 1.0000 | ORAL | 0 refills | Status: DC

## 2021-01-15 NOTE — Progress Notes (Signed)
Pt verified name, DOB, address and insurance during PV today.  Pt mailed instruction packet of Emmi video, copy of consent form to read and not return, and instructions. Plenvu  coupon mailed in packet. PV completed over the phone.  Pt encouraged to call with questions or issues.  My Chart instructions to pt as well    No egg or soy allergy known to patient  No issues known to pt with past sedation with any surgeries or procedures Patient denies ever being told they had issues or difficulty with intubation  No FH of Malignant Hyperthermia Pt is not on diet pills Pt is not on  home 02  Pt is not on blood thinners  Pt denies issues with constipation  No A fib or A flutter  Pt is fully vaccinated  for Covid   Plenvu  Coupon given to pt in PV today , Code to Pharmacy and  NO PA's for preps discussed with pt In PV today  Discussed with pt there will be an out-of-pocket cost for prep and that varies from $0 to 70 +  dollars - pt verbalized understanding   Due to the COVID-19 pandemic we are asking patients to follow certain guidelines in PV and the [facility]   Pt aware of COVID protocols and [facility] guidelines   

## 2021-01-17 ENCOUNTER — Encounter: Payer: Self-pay | Admitting: Gastroenterology

## 2021-01-30 ENCOUNTER — Other Ambulatory Visit: Payer: Self-pay

## 2021-01-30 ENCOUNTER — Ambulatory Visit (AMBULATORY_SURGERY_CENTER): Payer: 59 | Admitting: Gastroenterology

## 2021-01-30 ENCOUNTER — Encounter: Payer: Self-pay | Admitting: Gastroenterology

## 2021-01-30 VITALS — BP 113/70 | HR 75 | Temp 98.4°F | Resp 14 | Ht 61.0 in | Wt 178.0 lb

## 2021-01-30 DIAGNOSIS — Z1211 Encounter for screening for malignant neoplasm of colon: Secondary | ICD-10-CM | POA: Diagnosis not present

## 2021-01-30 DIAGNOSIS — E785 Hyperlipidemia, unspecified: Secondary | ICD-10-CM | POA: Diagnosis not present

## 2021-01-30 DIAGNOSIS — E119 Type 2 diabetes mellitus without complications: Secondary | ICD-10-CM | POA: Diagnosis not present

## 2021-01-30 MED ORDER — SODIUM CHLORIDE 0.9 % IV SOLN: 500.0000 mL | Freq: Once | INTRAVENOUS | Status: DC

## 2021-01-30 NOTE — Progress Notes (Signed)
History and Physical:  This patient presents for endoscopic testing for: Encounter Diagnosis  Name Primary?   Special screening for malignant neoplasms, colon Yes    Patient denies chronic GI symptoms  ROS: Patient denies chest pain or cough   Past Medical History: Past Medical History:  Diagnosis Date   Blood transfusion without reported diagnosis    Diabetes mellitus without complication (HCC)    GERD (gastroesophageal reflux disease)    Hyperlipidemia      Past Surgical History: Past Surgical History:  Procedure Laterality Date   ABDOMINAL HYSTERECTOMY     CHOLECYSTECTOMY N/A 10/20/2018   Procedure: LAPAROSCOPIC CHOLECYSTECTOMY;  Surgeon: Kinsinger, De Blanch, MD;  Location: [facility];  Service: General;  Laterality: N/A;   COLONOSCOPY     UTERINE FIBROID SURGERY      Allergies: No Known Allergies  Outpatient Meds: Current Outpatient Medications  Medication Sig Dispense Refill   emtricitabine-tenofovir (TRUVADA) 200-300 MG tablet Take 1 tablet by mouth daily.     metFORMIN (GLUCOPHAGE) 500 MG tablet Take 1 tablet (500 mg total) by mouth 2 (two) times daily with a meal. 120 tablet 0   Current Facility-Administered Medications  Medication Dose Route Frequency Provider Last Rate Last Admin   0.9 %  sodium chloride infusion  500 mL Intravenous Once Sherrilyn Rist, MD          ___________________________________________________________________ Objective   Exam:  BP 126/81   Pulse 86   Temp 98.4 F (36.9 C) (Temporal)   Ht 5\' 1"  (1.549 m)   Wt 178 lb (80.7 kg)   SpO2 99%   BMI 33.63 kg/m   CV: RRR without murmur, S1/S2 Resp: clear to auscultation bilaterally, normal RR and effort noted GI: soft, no tenderness, with active bowel sounds.   Assessment: Encounter Diagnosis  Name Primary?   Special screening for malignant neoplasms, colon Yes     Plan: Colonoscopy  The benefits and risks of the planned procedure were described in detail with the  patient or (when appropriate) their health care proxy.  Risks were outlined as including, but not limited to, bleeding, infection, perforation, adverse medication reaction leading to cardiac or pulmonary decompensation, pancreatitis (if ERCP).  The limitation of incomplete mucosal visualization was also discussed.  No guarantees or warranties were given.    The patient is appropriate for an endoscopic procedure in the ambulatory setting.   - , MD

## 2021-01-30 NOTE — Op Note (Signed)
[facility] Patient Name: Rachel Meza Procedure Date: 01/30/2021 9:29 AM MRN: [id number] Endoscopist: Sherilyn Cooter L. Myrtie Neither , MD Age: 50 Referring MD:  Date of Birth: 01-17-1971 Gender: Female Account #: [id number] Procedure:                Colonoscopy Indications:              Screening for colorectal malignant neoplasm, This                            is the patient's first colonoscopy Medicines:                Monitored Anesthesia Care Procedure:                Pre-Anesthesia Assessment:                           - Prior to the procedure, a History and Physical                            was performed, and patient medications and                            allergies were reviewed. The patient's tolerance of                            previous anesthesia was also reviewed. The risks                            and benefits of the procedure and the sedation                            options and risks were discussed with the patient.                            All questions were answered, and informed consent                            was obtained. Prior Anticoagulants: The patient has                            taken no previous anticoagulant or antiplatelet                            agents. ASA Grade Assessment: II - A patient with                            mild systemic disease. After reviewing the risks                            and benefits, the patient was deemed in                            satisfactory condition to undergo the procedure.  After obtaining informed consent, the colonoscope                            was passed under direct vision. Throughout the                            procedure, the patient's blood pressure, pulse, and                            oxygen saturations were monitored continuously. The                            CF [id number] #[id number] was introduced through the anus                            and advanced to the the  cecum, identified by                            appendiceal orifice and ileocecal valve. The                            colonoscopy was performed without difficulty. The                            patient tolerated the procedure well. The quality                            of the bowel preparation was excellent. The                            ileocecal valve, appendiceal orifice, and rectum                            were photographed. Scope In: 9:36:18 AM Scope Out: 9:47:20 AM Scope Withdrawal Time: 0 hours 8 minutes 6 seconds  Total Procedure Duration: 0 hours 11 minutes 2 seconds  Findings:                 The perianal and digital rectal examinations were                            normal.                           The entire examined colon appeared normal on direct                            and retroflexion views. Complications:            No immediate complications. Estimated Blood Loss:     Estimated blood loss: none. Impression:               - The entire examined colon is normal on direct and                            retroflexion views.                           -  No specimens collected. Recommendation:           - Patient has a contact number available for                            emergencies. The signs and symptoms of potential                            delayed complications were discussed with the                            patient. Return to normal activities tomorrow.                            Written discharge instructions were provided to the                            patient.                           - Resume previous diet.                           - Continue present medications.                           - Repeat colonoscopy in 10 years for screening                            purposes. Rachel Meza L. Myrtie Neither, MD 01/30/2021 9:52:55 AM This report has been signed electronically.

## 2021-01-30 NOTE — Progress Notes (Signed)
[facility]  Pt's states no medical or surgical changes since previsit or office visit.  

## 2021-01-30 NOTE — Patient Instructions (Signed)
YOU HAD AN ENDOSCOPIC PROCEDURE TODAY AT THE [facility]:   Refer to the procedure report that was given to you for any specific questions about what was found during the examination.  If the procedure report does not answer your questions, please call your gastroenterologist to clarify.  If you requested that your care partner not be given the details of your procedure findings, then the procedure report has been included in a sealed envelope for you to review at your convenience later. ° °YOU SHOULD EXPECT: Some feelings of bloating in the abdomen. Passage of more gas than usual.  Walking can help get rid of the air that was put into your GI tract during the procedure and reduce the bloating. If you had a lower endoscopy (such as a colonoscopy or flexible sigmoidoscopy) you may notice spotting of blood in your stool or on the toilet paper. If you underwent a bowel prep for your procedure, you may not have a normal bowel movement for a few days. ° °Please Note:  You might notice some irritation and congestion in your nose or some drainage.  This is from the oxygen used during your procedure.  There is no need for concern and it should clear up in a day or so. ° °SYMPTOMS TO REPORT IMMEDIATELY: ° °Following lower endoscopy (colonoscopy or flexible sigmoidoscopy): ° Excessive amounts of blood in the stool ° Significant tenderness or worsening of abdominal pains ° Swelling of the abdomen that is new, acute ° Fever of 100°F or higher ° °For urgent or emergent issues, a gastroenterologist can be reached at any hour by calling [phone]. °Do not use MyChart messaging for urgent concerns.  ° ° °DIET:  We do recommend a small meal at first, but then you may proceed to your regular diet.  Drink plenty of fluids but you should avoid alcoholic beverages for 24 hours. ° °ACTIVITY:  You should plan to take it easy for the rest of today and you should NOT DRIVE or use heavy machinery until tomorrow (because of  the sedation medicines used during the test).   ° °FOLLOW UP: °Our staff will call the number listed on your records 48-72 hours following your procedure to check on you and address any questions or concerns that you may have regarding the information given to you following your procedure. If we do not reach you, we will leave a message.  We will attempt to reach you two times.  During this call, we will ask if you have developed any symptoms of COVID 19. If you develop any symptoms (ie: fever, flu-like symptoms, shortness of breath, cough etc.) before then, please call [phone].  If you test positive for Covid 19 in the 2 weeks post procedure, please call and report this information to us.   ° °SIGNATURES/CONFIDENTIALITY: °You and/or your care partner have signed paperwork which will be entered into your electronic medical record.  These signatures attest to the fact that that the information above on your After Visit Summary has been reviewed and is understood.  Full responsibility of the confidentiality of this discharge information lies with you and/or your care-partner.  °

## 2021-01-30 NOTE — Progress Notes (Signed)
Pt Drowsy. VSS. To PACU, report to RN. No anesthetic complications noted.  

## 2021-02-01 ENCOUNTER — Telehealth: Payer: Self-pay

## 2021-02-01 ENCOUNTER — Telehealth: Payer: Self-pay | Admitting: *Deleted

## 2021-02-01 NOTE — Progress Notes (Signed)
Office Visit Note  Patient: Rachel Meza             Date of Birth: 1970/12/31           MRN: [id number]             PCP: Camillia Herter, NP Referring: Camillia Herter, NP Visit Date: 02/02/2021 Occupation: [employer]  Subjective:  New Patient (Initial Visit) (Bil shoulder pain, bil foot pain, bil knee pain, bil hand pain, abnormal labs)   History of Present Illness: Rachel Meza is a 50 y.o. female here for evaluation of generalized body pains with abnormal labs including positive ANA, positive RF, and elevated sedimentation rate. She started having pain and numbness symptoms in her bilateral hands since around 2008 with no specific onset or associated events that she can recall. This occasionally worsened with pain radiating up the arm and wrist as well. She has noticed increase in pain in her shoulders and knees during the past few years also gradual onset and progression. She experiences some swelling in her feet and toes but not elsewhere at involved areas. The hands often also feel numb sometimes throughout sometimes limited to specific fingers. She occasionally loses her grip spontaneously but has normal grip strength much of the time.  She has not noticed any significant discoloration. She does not take any medications for these problems she is not sure what would be useful. She has no past major joint injury or surgery.  Labs reviewed 12/2020 ANA pos RF 70.9 ESR 56 CBC wnl CMP wnl Hgb A1c 6.8% HCV neg BV pos   Activities of Daily Living:  Patient reports morning stiffness for 12 hours.   Patient Reports nocturnal pain.  Difficulty dressing/grooming: Denies Difficulty climbing stairs: Reports Difficulty getting out of chair: Denies Difficulty using hands for taps, buttons, cutlery, and/or writing: Reports  Review of Systems  Constitutional:  Positive for fatigue.  HENT:  Positive for mouth dryness.   Eyes:  Positive for dryness.  Respiratory:  Positive for shortness of  breath.   Cardiovascular:  Positive for swelling in legs/feet.  Gastrointestinal:  Positive for diarrhea.  Endocrine: Positive for cold intolerance, heat intolerance and increased urination.  Genitourinary:  Negative for difficulty urinating.  Musculoskeletal:  Positive for joint pain, gait problem, joint pain, joint swelling, muscle weakness, morning stiffness and muscle tenderness.  Skin:  Positive for rash.  Allergic/Immunologic: Negative for susceptible to infections.  Neurological:  Positive for numbness and weakness.  Hematological:  Negative for bruising/bleeding tendency.  Psychiatric/Behavioral:  Positive for sleep disturbance.    PMFS History:  Patient Active Problem List   Diagnosis Date Noted   Bilateral hand pain 02/02/2021   Positive ANA (antinuclear antibody) 02/02/2021   Diabetes ([place]) 12/28/2020   Bacterial vaginitis 12/28/2020   Cholecystitis 10/20/2018    Past Medical History:  Diagnosis Date   Blood transfusion without reported diagnosis    Diabetes mellitus without complication ([place])    GERD (gastroesophageal reflux disease)    Hyperlipidemia     Family History  Problem Relation Age of Onset   Colon cancer Neg Hx    Colon polyps Neg Hx    Esophageal cancer Neg Hx    Rectal cancer Neg Hx    Stomach cancer Neg Hx    Past Surgical History:  Procedure Laterality Date   ABDOMINAL HYSTERECTOMY     CHOLECYSTECTOMY N/A 10/20/2018   Procedure: LAPAROSCOPIC CHOLECYSTECTOMY;  Surgeon: Mickeal Skinner, MD;  Location: [facility];  Service: General;  Laterality: N/A;   COLONOSCOPY     UTERINE FIBROID SURGERY     Social History   Social History Narrative   Not on file   Immunization History  Administered Date(s) Administered   Influenza-Unspecified 11/03/2020   Pneumococcal Polysaccharide-23 10/21/2018     Objective: Vital Signs: BP 125/79 (BP Location: Right Arm, Patient Position: Sitting, Cuff Size: Normal)   Pulse (!) 103   Resp 14   Ht _0   (1.549 m)   Wt 180 lb (81.6 kg)   BMI 34.01 kg/m    Physical Exam HENT:     Right Ear: External ear normal.     Left Ear: External ear normal.     Mouth/Throat:     Mouth: Mucous membranes are moist.     Pharynx: Oropharynx is clear.  Eyes:     Conjunctiva/sclera: Conjunctivae normal.  Cardiovascular:     Rate and Rhythm: Regular rhythm. Tachycardia present.  Pulmonary:     Effort: Pulmonary effort is normal.     Breath sounds: Normal breath sounds.  Musculoskeletal:     Right lower leg: No edema.     Left lower leg: No edema.  Skin:    General: Skin is warm and dry.     Findings: No rash.  Neurological:     General: No focal deficit present.     Mental Status: She is alert.     Deep Tendon Reflexes: Reflexes normal.     Comments: Normal grip strength Negative tinel's sign  Psychiatric:        Mood and Affect: Mood normal.     Musculoskeletal Exam:  Neck full ROM no tenderness Shoulders full ROM, anterior right shoulder pain with abduction and  Elbows full ROM no tenderness or swelling Wrists full ROM no tenderness or swelling Fingers full ROM no tenderness or swelling Knees full ROM no tenderness or swelling Ankles full ROM no tenderness or swelling 1st MTP joints bony enlargement decreased extension ROM, callus present on left great toe    Investigation: No additional findings.  Imaging: No results found.  Recent Labs: Lab Results  Component Value Date   WBC 7.3 12/27/2020   HGB 13.5 12/27/2020   PLT 325 12/27/2020   NA 139 12/27/2020   K 4.0 12/27/2020   CL 105 12/27/2020   CO2 20 12/27/2020   GLUCOSE 104 (H) 12/27/2020   BUN 13 12/27/2020   CREATININE 0.63 12/27/2020   BILITOT 0.3 12/27/2020   ALKPHOS 120 12/27/2020   AST 19 12/27/2020   ALT 26 12/27/2020   PROT 7.4 12/27/2020   ALBUMIN 4.6 12/27/2020   CALCIUM 9.5 12/27/2020   GFRAA >60 10/19/2018    Speciality Comments: No specialty comments available.  Procedures:  No procedures  performed Allergies: Patient has no known allergies.   Assessment / Plan:     Visit Diagnoses: Positive ANA (antinuclear antibody) - Plan: C-reactive protein, Anti-scleroderma antibody, RNP Antibody, Anti-Smith antibody, Sjogrens syndrome-A extractable nuclear antibody, Anti-DNA antibody, double-stranded  Positive ANA and positive rheumatoid factor and sedimentation rate does not have specific clinical criteria of autoimmune disease at this time.  We will check CRP we will also check specific antibody panel as detailed above.  My initial suspicion for systemic connective tissue disease such as lupus is low.  With a positive rheumatoid factor could potentially indicate some type of overlap problem.  Bilateral hand pain  Physical exam is completely nonspecific today.  There does not appear to be severe osteoarthritis of the hands.  Do not see any active synovitis.  Could also have possible carpal tunnel syndrome causing pain and numbness in bilateral hands.  Orders: Orders Placed This Encounter  Procedures   C-reactive protein   Anti-scleroderma antibody   RNP Antibody   Anti-Smith antibody   Sjogrens syndrome-A extractable nuclear antibody   Anti-DNA antibody, double-stranded    No orders of the defined types were placed in this encounter.    Follow-Up Instructions: Return in about 2 weeks (around 02/16/2021) for New pt +ANA/+RF/ ?CTS f/u.   Collier Salina, MD  Note - This record has been created using [employer].  Chart creation errors have been sought, but may not always  have been located. Such creation errors do not reflect on  the standard of medical care.

## 2021-02-01 NOTE — Telephone Encounter (Signed)
Called [phone]  and left a message we tried to reach pt for a follow up call. maw

## 2021-02-01 NOTE — Telephone Encounter (Signed)
Second follow up call attempt.  Message left previously this morning. °

## 2021-02-02 ENCOUNTER — Encounter: Payer: Self-pay | Admitting: Internal Medicine

## 2021-02-02 ENCOUNTER — Ambulatory Visit (INDEPENDENT_AMBULATORY_CARE_PROVIDER_SITE_OTHER): Payer: 59 | Admitting: Internal Medicine

## 2021-02-02 ENCOUNTER — Other Ambulatory Visit: Payer: Self-pay

## 2021-02-02 VITALS — BP 125/79 | HR 103 | Resp 14 | Ht 61.0 in | Wt 180.0 lb

## 2021-02-02 DIAGNOSIS — M79642 Pain in left hand: Secondary | ICD-10-CM | POA: Diagnosis not present

## 2021-02-02 DIAGNOSIS — R768 Other specified abnormal immunological findings in serum: Secondary | ICD-10-CM

## 2021-02-02 DIAGNOSIS — M059 Rheumatoid arthritis with rheumatoid factor, unspecified: Secondary | ICD-10-CM | POA: Insufficient documentation

## 2021-02-02 DIAGNOSIS — M79641 Pain in right hand: Secondary | ICD-10-CM

## 2021-02-05 LAB — ANTI-SMITH ANTIBODY: ENA SM Ab Ser-aCnc: <1 AI

## 2021-02-05 LAB — C-REACTIVE PROTEIN: CRP: 4.3 mg/L

## 2021-02-05 LAB — ANTI-SCLERODERMA ANTIBODY: Scleroderma (Scl-70) (ENA) Antibody, IgG: <1 AI

## 2021-02-05 LAB — RNP ANTIBODY: Ribonucleic Protein(ENA) Antibody, IgG: 1.8 AI — AB

## 2021-02-05 LAB — SJOGRENS SYNDROME-A EXTRACTABLE NUCLEAR ANTIBODY: SSA (Ro) (ENA) Antibody, IgG: <1 AI

## 2021-02-05 LAB — ANTI-DNA ANTIBODY, DOUBLE-STRANDED: ds DNA Ab: <1 IU/mL

## 2021-02-13 ENCOUNTER — Ambulatory Visit
Admission: RE | Admit: 2021-02-13 | Discharge: 2021-02-13 | Disposition: A | Discharge status: Home / Self Care | Payer: 59 | Source: Ambulatory Visit | Attending: Family | Admitting: Family

## 2021-02-13 DIAGNOSIS — Z1231 Encounter for screening mammogram for malignant neoplasm of breast: Secondary | ICD-10-CM | POA: Diagnosis not present

## 2021-02-16 ENCOUNTER — Other Ambulatory Visit: Payer: Self-pay | Admitting: Family

## 2021-02-16 DIAGNOSIS — R928 Other abnormal and inconclusive findings on diagnostic imaging of breast: Secondary | ICD-10-CM

## 2021-02-18 NOTE — Progress Notes (Signed)
Office Visit Note  Patient: Rachel Meza             Date of Birth: 08-15-1970           MRN: [id number]             PCP: Camillia Herter, NP Referring: Camillia Herter, NP Visit Date: 02/19/2021   Subjective:  Follow-up (Doing good)   History of Present Illness: Rachel Meza is a 50 y.o. female here for follow up with multiple joint and body pains with positive ANA, RF, and elevated ESR. Labs at initial visit showed low positive RNP 1.8 other ENA tests negative.  There have been no significant change in symptoms since our last visit.  She reports some episodic low back pain but not ongoing at this time.  No large increase in joint swelling erythema or range of motion change.  Previous HPI 02/02/21 Rachel Meza is a 50 y.o. female here for evaluation of generalized body pains with abnormal labs including positive ANA, positive RF, and elevated sedimentation rate. She started having pain and numbness symptoms in her bilateral hands since around 2008 with no specific onset or associated events that she can recall. This occasionally worsened with pain radiating up the arm and wrist as well. She has noticed increase in pain in her shoulders and knees during the past few years also gradual onset and progression. She experiences some swelling in her feet and toes but not elsewhere at involved areas. The hands often also feel numb sometimes throughout sometimes limited to specific fingers. She occasionally loses her grip spontaneously but has normal grip strength much of the time.  She has not noticed any significant discoloration. She does not take any medications for these problems she is not sure what would be useful. She has no past major joint injury or surgery.   Labs reviewed 12/2020 ANA pos RF 70.9 ESR 56 CBC wnl CMP wnl Hgb A1c 6.8% HCV neg BV pos   Review of Systems  Constitutional:  Negative for fatigue.  HENT:  Negative for mouth dryness.   Eyes:  Negative for dryness.   Respiratory:  Negative for shortness of breath.   Cardiovascular:  Positive for swelling in legs/feet.  Gastrointestinal:  Negative for constipation.  Endocrine: Positive for cold intolerance, heat intolerance and increased urination.  Genitourinary:  Negative for difficulty urinating.  Musculoskeletal:  Positive for joint pain, joint pain, muscle weakness, morning stiffness and muscle tenderness.  Skin:  Negative for rash.  Allergic/Immunologic: Negative for susceptible to infections.  Neurological:  Positive for numbness.  Hematological:  Negative for bruising/bleeding tendency.  Psychiatric/Behavioral:  Positive for sleep disturbance.    PMFS History:  Patient Active Problem List   Diagnosis Date Noted   Rheumatoid factor positive 02/19/2021   Bilateral hand pain 02/02/2021   Positive ANA (antinuclear antibody) 02/02/2021   Diabetes ([place]) 12/28/2020   Bacterial vaginitis 12/28/2020   Cholecystitis 10/20/2018    Past Medical History:  Diagnosis Date   Blood transfusion without reported diagnosis    Diabetes mellitus without complication ([facility])    GERD (gastroesophageal reflux disease)    Hyperlipidemia     Family History  Problem Relation Age of Onset   Colon cancer Neg Hx    Colon polyps Neg Hx    Esophageal cancer Neg Hx    Rectal cancer Neg Hx    Stomach cancer Neg Hx    Past Surgical History:  Procedure Laterality Date   ABDOMINAL HYSTERECTOMY  CHOLECYSTECTOMY N/A 10/20/2018   Procedure: LAPAROSCOPIC CHOLECYSTECTOMY;  Surgeon: Kinsinger, Arta Bruce, MD;  Location: [facility];  Service: General;  Laterality: N/A;   COLONOSCOPY     UTERINE FIBROID SURGERY     Social History   Social History Narrative   Not on file   Immunization History  Administered Date(s) Administered   Influenza-Unspecified 11/03/2020   Pneumococcal Polysaccharide-23 10/21/2018     Objective: Vital Signs: BP 124/65 (BP Location: Left Arm, Patient Position: Sitting, Cuff Size: Normal)    Pulse 99   Resp 15   Ht '5\' 1"'  (1.549 m)   Wt 179 lb (81.2 kg)   BMI 33.82 kg/m    Physical Exam Eyes:     Conjunctiva/sclera: Conjunctivae normal.  Musculoskeletal:     Right lower leg: No edema.     Left lower leg: No edema.  Skin:    General: Skin is warm and dry.     Findings: No rash.  Neurological:     Mental Status: She is alert.  Psychiatric:        Mood and Affect: Mood normal.     Musculoskeletal Exam:  Elbows full ROM no tenderness or swelling Wrists full ROM no tenderness or swelling Fingers full ROM no tenderness or swelling Knees full ROM no tenderness or swelling   Investigation: No additional findings.  Imaging: MM 3D SCREEN BREAST BILATERAL  Result Date: 02/13/2021 CLINICAL DATA:  Screening. EXAM: DIGITAL SCREENING BILATERAL MAMMOGRAM WITH TOMOSYNTHESIS AND CAD TECHNIQUE: Bilateral screening digital craniocaudal and mediolateral oblique mammograms were obtained. Bilateral screening digital breast tomosynthesis was performed. The images were evaluated with computer-aided detection. COMPARISON:  None ACR Breast Density Category b: There are scattered areas of fibroglandular density. FINDINGS: In the right breast, a possible asymmetry warrants further evaluation. In the left breast, no findings suspicious for malignancy. IMPRESSION: Further evaluation is suggested for possible asymmetry in the right breast. RECOMMENDATION: Diagnostic mammogram and possibly ultrasound of the right breast. (Code:[id number]-87M) The patient will be contacted regarding the findings, and additional imaging will be scheduled. BI-RADS CATEGORY  0: Incomplete. Need additional imaging evaluation and/or prior mammograms for comparison. Electronically Signed   By: Abelardo Diesel M.D.   On: 02/13/2021 13:45    Recent Labs: Lab Results  Component Value Date   WBC 7.3 12/27/2020   HGB 13.5 12/27/2020   PLT 325 12/27/2020   NA 139 12/27/2020   K 4.0 12/27/2020   CL 105 12/27/2020   CO2 20  12/27/2020   GLUCOSE 104 (H) 12/27/2020   BUN 13 12/27/2020   CREATININE 0.63 12/27/2020   BILITOT 0.3 12/27/2020   ALKPHOS 120 12/27/2020   AST 19 12/27/2020   ALT 26 12/27/2020   PROT 7.4 12/27/2020   ALBUMIN 4.6 12/27/2020   CALCIUM 9.5 12/27/2020   GFRAA >60 10/19/2018    Speciality Comments: No specialty comments available.  Procedures:  No procedures performed Allergies: Patient has no known allergies.   Assessment / Plan:     Visit Diagnoses: Positive ANA (antinuclear antibody) Rheumatoid factor positive Bilateral hand pain  Specific antibody markers for the positive ANA are negative and no new clinical signs for disease are present at this time.  She did have moderately high ESR and positive rheumatoid factor but clinically no inflammatory joint changes visible today. Do not recommend starting DMARD treatment.  Because of the symptoms and lab abnormality recommend follow-up in about 6 months to recheck could be early disease picture.  Orders: No orders of the defined  types were placed in this encounter.  No orders of the defined types were placed in this encounter.    Follow-Up Instructions: Return in about 6 months (around 08/19/2021) for +RF/+dsDNA f/u obs 87mo.   CCollier Salina MD  Note - This record has been created using [employer]  Chart creation errors have been sought, but may not always  have been located. Such creation errors do not reflect on  the standard of medical care.

## 2021-02-19 ENCOUNTER — Encounter: Payer: Self-pay | Admitting: Internal Medicine

## 2021-02-19 ENCOUNTER — Ambulatory Visit (INDEPENDENT_AMBULATORY_CARE_PROVIDER_SITE_OTHER): Payer: 59 | Admitting: Internal Medicine

## 2021-02-19 ENCOUNTER — Other Ambulatory Visit: Payer: Self-pay

## 2021-02-19 VITALS — BP 124/65 | HR 99 | Resp 15 | Ht 61.0 in | Wt 179.0 lb

## 2021-02-19 DIAGNOSIS — M79641 Pain in right hand: Secondary | ICD-10-CM

## 2021-02-19 DIAGNOSIS — R768 Other specified abnormal immunological findings in serum: Secondary | ICD-10-CM

## 2021-02-19 DIAGNOSIS — M79642 Pain in left hand: Secondary | ICD-10-CM | POA: Diagnosis not present

## 2021-03-07 ENCOUNTER — Other Ambulatory Visit: Payer: Self-pay | Admitting: Family

## 2021-03-07 DIAGNOSIS — E119 Type 2 diabetes mellitus without complications: Secondary | ICD-10-CM

## 2021-03-15 ENCOUNTER — Other Ambulatory Visit: Payer: Self-pay | Admitting: Family

## 2021-03-15 ENCOUNTER — Ambulatory Visit
Admission: RE | Admit: 2021-03-15 | Discharge: 2021-03-15 | Disposition: A | Discharge status: Home / Self Care | Payer: 59 | Source: Ambulatory Visit | Attending: Family | Admitting: Family

## 2021-03-15 ENCOUNTER — Other Ambulatory Visit: Payer: Self-pay

## 2021-03-15 DIAGNOSIS — R928 Other abnormal and inconclusive findings on diagnostic imaging of breast: Secondary | ICD-10-CM | POA: Diagnosis not present

## 2021-03-15 DIAGNOSIS — N6489 Other specified disorders of breast: Secondary | ICD-10-CM | POA: Diagnosis not present

## 2021-05-08 ENCOUNTER — Other Ambulatory Visit: Payer: Self-pay | Admitting: Family

## 2021-05-08 DIAGNOSIS — E119 Type 2 diabetes mellitus without complications: Secondary | ICD-10-CM

## 2021-07-14 ENCOUNTER — Other Ambulatory Visit: Payer: Self-pay | Admitting: Family

## 2021-07-14 DIAGNOSIS — E119 Type 2 diabetes mellitus without complications: Secondary | ICD-10-CM

## 2021-08-13 NOTE — Progress Notes (Signed)
? ?Office Visit Note ? ?Patient: Rachel Meza             ?Date of Birth: [birth year]           ?MRN: [id number]             ?PCP: Camillia Herter, NP ?Referring: Camillia Herter, NP ?Visit Date: 08/20/2021 ? ? ?Subjective:  ?Follow-up (No improvement) ? ? ?History of Present Illness: Rachel Meza is a 51 y.o. female here for follow up for possible RA with bilateral hand pain positive RF and elevated sedimentation rate. Since our last visit symptoms remain similar or sightly worse over time. She has morning stiffness but now having more prolonged symptoms extending throughout the day as well. She is taking PRN tylenol more often due to joint pain. ? ? ?Previous HPI ?02/19/2021 ? Rachel Meza is a 51 y.o. female here for follow up with multiple joint and body pains with positive ANA, RF, and elevated ESR. Labs at initial visit showed low positive RNP 1.8 other ENA tests negative.  There have been no significant change in symptoms since our last visit.  She reports some episodic low back pain but not ongoing at this time.  No large increase in joint swelling erythema or range of motion change. ?  ?Previous HPI ?02/02/21 ?Rachel Meza is a 51 y.o. female here for evaluation of generalized body pains with abnormal labs including positive ANA, positive RF, and elevated sedimentation rate. She started having pain and numbness symptoms in her bilateral hands since around 2008 with no specific onset or associated events that she can recall. This occasionally worsened with pain radiating up the arm and wrist as well. She has noticed increase in pain in her shoulders and knees during the past few years also gradual onset and progression. She experiences some swelling in her feet and toes but not elsewhere at involved areas. The hands often also feel numb sometimes throughout sometimes limited to specific fingers. She occasionally loses her grip spontaneously but has normal grip strength much of the time.  She has not  noticed any significant discoloration. She does not take any medications for these problems she is not sure what would be useful. She has no past major joint injury or surgery. ?  ?Labs reviewed ?12/2020 ?ANA pos ?RF 70.9 ?ESR 56 ?CBC wnl ?CMP wnl ?Hgb A1c 6.8% ?HCV neg ?BV pos ? ? ?Review of Systems  ?Constitutional:  Positive for fatigue.  ?HENT:  Positive for mouth dryness.   ?Eyes:  Negative for dryness.  ?Respiratory:  Negative for shortness of breath.   ?Cardiovascular:  Positive for swelling in legs/feet.  ?Gastrointestinal:  Positive for constipation.  ?Endocrine: Positive for cold intolerance, heat intolerance and increased urination.  ?Genitourinary:  Negative for difficulty urinating.  ?Musculoskeletal:  Positive for joint pain, gait problem, joint pain, joint swelling, muscle weakness, morning stiffness and muscle tenderness.  ?Skin:  Negative for rash.  ?Allergic/Immunologic: Negative for susceptible to infections.  ?Neurological:  Positive for numbness and weakness.  ?Hematological:  Negative for bruising/bleeding tendency.  ?Psychiatric/Behavioral:  Positive for sleep disturbance.   ? ?PMFS History:  ?Patient Active Problem List  ? Diagnosis Date Noted  ? High risk medication use 08/20/2021  ? Rheumatoid factor positive 02/19/2021  ? Seropositive rheumatoid arthritis ([place]) 02/02/2021  ? Positive ANA (antinuclear antibody) 02/02/2021  ? Diabetes ([place]) 12/28/2020  ? Bacterial vaginitis 12/28/2020  ? Cholecystitis 10/20/2018  ?  ?Past Medical History:  ?Diagnosis Date  ? Blood transfusion  without reported diagnosis   ? Diabetes mellitus without complication ([place])   ? GERD (gastroesophageal reflux disease)   ? Hyperlipidemia   ?  ?Family History  ?Problem Relation Age of Onset  ? Colon cancer Neg Hx   ? Colon polyps Neg Hx   ? Esophageal cancer Neg Hx   ? Rectal cancer Neg Hx   ? Stomach cancer Neg Hx   ? ?Past Surgical History:  ?Procedure Laterality Date  ? ABDOMINAL HYSTERECTOMY    ? CHOLECYSTECTOMY N/A  10/20/2018  ? Procedure: LAPAROSCOPIC CHOLECYSTECTOMY;  Surgeon: Kinsinger, Arta Bruce, MD;  Location: [facility];  Service: General;  Laterality: N/A;  ? COLONOSCOPY    ? UTERINE FIBROID SURGERY    ? ?Social History  ? ?Social History Narrative  ? Not on file  ? ?Immunization History  ?Administered Date(s) Administered  ? Influenza-Unspecified 11/03/2020  ? Pneumococcal Polysaccharide-23 10/21/2018  ?  ? ?Objective: ?Vital Signs: BP 123/77 (BP Location: Left Arm, Patient Position: Sitting, Cuff Size: Normal)   Pulse 96   Resp 14   Ht _0  (1.575 m)   Wt 177 lb (80.3 kg)   BMI 32.37 kg/m?   ? ?Physical Exam ?Constitutional:   ?   Appearance: She is obese.  ?Cardiovascular:  ?   Rate and Rhythm: Normal rate and regular rhythm.  ?Pulmonary:  ?   Effort: Pulmonary effort is normal.  ?   Breath sounds: Normal breath sounds.  ?Musculoskeletal:  ?   Right lower leg: No edema.  ?   Left lower leg: No edema.  ?Skin: ?   General: Skin is warm and dry.  ?   Findings: No rash.  ?Neurological:  ?   Mental Status: She is alert.  ?Psychiatric:     ?   Mood and Affect: Mood normal.  ?  ? ?Musculoskeletal Exam:  ?Neck full ROM no tenderness ?Shoulders full ROM no swelling, mild shoulder tenderness laterally ?Elbows full ROM no tenderness or swelling ?Wrists full ROM no tenderness or swelling ?Fingers slightly decreased flexion ROM in hands MCP joint swelling in at least 2nd-3rd digits b/l ?Knees full ROM no tenderness or swelling ? ? ?CDAI Exam: ?CDAI Score: 12  ?Patient Global: 40 mm; Provider Global: 20 mm ?Swollen: 4 ; Tender: 2  ?Joint Exam 08/20/2021  ? ?   Right  Left  ?Glenohumeral   Tender   Tender  ?MCP 2  Swollen   Swollen   ?MCP 3  Swollen   Swollen   ? ? ? ?Investigation: ?No additional findings. ? ?Imaging: ?No results found. ? ?Recent Labs: ?Lab Results  ?Component Value Date  ? WBC 7.3 12/27/2020  ? HGB 13.5 12/27/2020  ? PLT 325 12/27/2020  ? NA 139 12/27/2020  ? K 4.0 12/27/2020  ? CL 105 12/27/2020  ? CO2 20  12/27/2020  ? GLUCOSE 104 (H) 12/27/2020  ? BUN 13 12/27/2020  ? CREATININE 0.63 12/27/2020  ? BILITOT 0.3 12/27/2020  ? ALKPHOS 120 12/27/2020  ? AST 19 12/27/2020  ? ALT 26 12/27/2020  ? PROT 7.4 12/27/2020  ? ALBUMIN 4.6 12/27/2020  ? CALCIUM 9.5 12/27/2020  ? GFRAA >60 10/19/2018  ? ? ?Speciality Comments: No specialty comments available. ? ?Procedures:  ?No procedures performed ?Allergies: Patient has no known allergies.  ? ?Assessment / Plan:     ?Visit Diagnoses: Seropositive rheumatoid arthritis ([place]) - Plan: Sedimentation rate, C-reactive protein ? ?Symptoms do not look more consistent for seropositive rheumatoid arthritis with some active swelling  on exam today and worsening of symptoms over time since last visit.  Recommend starting methotrexate 15 mg p.o. weekly folic acid 1 mg daily. ? ?Positive ANA (antinuclear antibody) - Specific antibody markers for the positive ANA are negative and no new clinical signs for disease are present at this time.  ? ?High risk medication use - Plan: CBC with Differential/Platelet, COMPLETE METABOLIC PANEL WITH GFR, Hepatitis B core antibody, IgM, Hepatitis B surface antigen, QuantiFERON-TB Gold Plus ? ?Discussed risks of methotrexate including GI upset, alopecia, oral ulcers, and need for laboratory monitoring for cytopenia or hepatotoxicity.  Checking CBC and CMP for baseline today.  Checking hepatitis serology.  Checking QuantiFERON before beginning immunomodulatory therapy.  Discussed limiting alcohol consumption while taking methotrexate to preferably none or very little. ? ?Orders: ?Orders Placed This Encounter  ?Procedures  ? Sedimentation rate  ? C-reactive protein  ? CBC with Differential/Platelet  ? COMPLETE METABOLIC PANEL WITH GFR  ? Hepatitis B core antibody, IgM  ? Hepatitis B surface antigen  ? QuantiFERON-TB Gold Plus  ? ?Meds ordered this encounter  ?Medications  ? methotrexate (RHEUMATREX) 2.5 MG tablet  ?  Sig: Take 6 tablets (15 mg total) by mouth once  a week. Caution:Chemotherapy. Protect from light.  ?  Dispense:  30 tablet  ?  Refill:  0  ? folic acid (FOLVITE) 1 MG tablet  ?  Sig: Take 1 tablet (1 mg total) by mouth daily.  ?  Dispense:  90 tablet  ?  Ref

## 2021-08-20 ENCOUNTER — Ambulatory Visit (INDEPENDENT_AMBULATORY_CARE_PROVIDER_SITE_OTHER): Payer: 59 | Admitting: Internal Medicine

## 2021-08-20 ENCOUNTER — Encounter: Payer: Self-pay | Admitting: Internal Medicine

## 2021-08-20 VITALS — BP 123/77 | HR 96 | Resp 14 | Ht 62.0 in | Wt 177.0 lb

## 2021-08-20 DIAGNOSIS — M79642 Pain in left hand: Secondary | ICD-10-CM

## 2021-08-20 DIAGNOSIS — R768 Other specified abnormal immunological findings in serum: Secondary | ICD-10-CM | POA: Diagnosis not present

## 2021-08-20 DIAGNOSIS — M059 Rheumatoid arthritis with rheumatoid factor, unspecified: Secondary | ICD-10-CM

## 2021-08-20 DIAGNOSIS — M79641 Pain in right hand: Secondary | ICD-10-CM | POA: Diagnosis not present

## 2021-08-20 DIAGNOSIS — Z79899 Other long term (current) drug therapy: Secondary | ICD-10-CM

## 2021-08-20 MED ORDER — FOLIC ACID 1 MG PO TABS: 1.0000 mg | ORAL_TABLET | Freq: Every day | ORAL | 0 refills | Status: DC

## 2021-08-20 MED ORDER — METHOTREXATE 2.5 MG PO TABS: 15.0000 mg | ORAL_TABLET | ORAL | 0 refills | Status: DC

## 2021-08-20 NOTE — Patient Instructions (Signed)
Methotrexate Tablets What is this medication? METHOTREXATE (METH oh TREX ate) treats inflammatory conditions such as arthritis and psoriasis. It works by decreasing inflammation, which can reduce pain and prevent long-term injury to the joints and skin. It may also be used to treat some types of cancer. It works by slowing down the growth of cancer cells. This medicine may be used for other purposes; ask your health care provider or pharmacist if you have questions. COMMON BRAND NAME(S): Rheumatrex, Trexall What should I tell my care team before I take this medication? They need to know if you have any of these conditions: Fluid in the stomach area or lungs If you often drink alcohol Infection or immune system problems Kidney disease or on hemodialysis Liver disease Low blood counts, like low white cell, platelet, or red cell counts Lung disease Radiation therapy Stomach ulcers Ulcerative colitis An unusual or allergic reaction to methotrexate, other medications, foods, dyes, or preservatives Pregnant or trying to get pregnant Breast-feeding How should I use this medication? Take this medication by mouth with a glass of water. Follow the directions on the prescription label. Take your medication at regular intervals. Do not take it more often than directed. Do not stop taking except on your care team's advice. Make sure you know why you are taking this medication and how often you should take it. If this medication is used for a condition that is not cancer, like arthritis or psoriasis, it should be taken weekly, NOT daily. Taking this medication more often than directed can cause serious side effects, even death. Talk to your care team about safe handling and disposal of this medication. You may need to take special precautions. Talk to your care team about the use of this medication in children. While this medication may be prescribed for selected conditions, precautions do  apply. Overdosage: If you think you have taken too much of this medicine contact a poison control center or emergency room at once. NOTE: This medicine is only for you. Do not share this medicine with others. What if I miss a dose? If you miss a dose, talk with your care team. Do not take double or extra doses. What may interact with this medication? Do not take this medication with any of the following: Acitretin This medication may also interact with the following: Aspirin and aspirin-like medications including salicylates Azathioprine Certain antibiotics like penicillins, tetracycline, and chloramphenicol Certain medications that treat or prevent blood clots like warfarin, apixaban, dabigatran, and rivaroxaban Certain medications for stomach problems like esomeprazole, omeprazole, pantoprazole Cyclosporine Dapsone Diuretics Gold Hydroxychloroquine Live virus vaccines Medications for infection like acyclovir, adefovir, amphotericin B, bacitracin, cidofovir, foscarnet, ganciclovir, gentamicin, pentamidine, vancomycin Mercaptopurine NSAIDs, medications for pain and inflammation, like ibuprofen or naproxen Other cytotoxic agents Pamidronate Pemetrexed Penicillamine Phenylbutazone Phenytoin Probenecid Pyrimethamine Retinoids such as isotretinoin and tretinoin Steroid medications like prednisone or cortisone Sulfonamides like sulfasalazine and trimethoprim/sulfamethoxazole Theophylline Zoledronic acid This list may not describe all possible interactions. Give your health care provider a list of all the medicines, herbs, non-prescription drugs, or dietary supplements you use. Also tell them if you smoke, drink alcohol, or use illegal drugs. Some items may interact with your medicine. What should I watch for while using this medication? Avoid alcoholic drinks. This medication can make you more sensitive to the sun. Keep out of the sun. If you cannot avoid being in the sun, wear  protective clothing and use sunscreen. Do not use sun lamps or tanning beds/booths. You may need   blood work done while you are taking this medication. Call your care team for advice if you get a fever, chills or sore throat, or other symptoms of a cold or flu. Do not treat yourself. This medication decreases your body's ability to fight infections. Try to avoid being around people who are sick. This medication may increase your risk to bruise or bleed. Call your care team if you notice any unusual bleeding. Be careful brushing or flossing your teeth or using a toothpick because you may get an infection or bleed more easily. If you have any dental work done, tell your dentist you are receiving this medication. Check with your care team if you get an attack of severe diarrhea, nausea and vomiting, or if you sweat a lot. The loss of too much body fluid can make it dangerous for you to take this medication. Talk to your care team about your risk of cancer. You may be more at risk for certain types of cancers if you take this medication. Do not become pregnant while taking this medication or for 6 months after stopping it. Women should inform their care team if they wish to become pregnant or think they might be pregnant. Men should not father a child while taking this medication and for 3 months after stopping it. There is potential for serious harm to an unborn child. Talk to your care team for more information. Do not breast-feed an infant while taking this medication or for 1 week after stopping it. This medication may make it more difficult to get pregnant or father a child. Talk to your care team if you are concerned about your fertility. What side effects may I notice from receiving this medication? Side effects that you should report to your care team as soon as possible: Allergic reactions--skin rash, itching, hives, swelling of the face, lips, tongue, or throat Blood clot--pain, swelling, or warmth  in the leg, shortness of breath, chest pain Dry cough, shortness of breath or trouble breathing Infection--fever, chills, cough, sore throat, wounds that don't heal, pain or trouble when passing urine, general feeling of discomfort or being unwell Kidney injury--decrease in the amount of urine, swelling of the ankles, hands, or feet Liver injury--right upper belly pain, loss of appetite, nausea, light-colored stool, dark yellow or brown urine, yellowing of the skin or eyes, unusual weakness or fatigue Low red blood cell count--unusual weakness or fatigue, dizziness, headache, trouble breathing Redness, blistering, peeling, or loosening of the skin, including inside the mouth Seizures Unusual bruising or bleeding Side effects that usually do not require medical attention (report to your care team if they continue or are bothersome): Diarrhea Dizziness Hair loss Nausea Pain, redness, or swelling with sores inside the mouth or throat Vomiting This list may not describe all possible side effects. Call your doctor for medical advice about side effects. You may report side effects to FDA at 1-800-FDA-1088. Where should I keep my medication? Keep out of the reach of children and pets. Store at room temperature between 20 and 25 degrees C (68 and 77 degrees F). Protect from light. Get rid of any unused medication after the expiration date. Talk to your care team about how to dispose of unused medication. Special directions may apply. NOTE: This sheet is a summary. It may not cover all possible information. If you have questions about this medicine, talk to your doctor, pharmacist, or health care provider.  2023 [employer]vier/Gold Standard (2020-05-29 00:00:00)  

## 2021-08-21 NOTE — Progress Notes (Signed)
Inflammatory markers are better than last year but still abnormal consistent with joint inflammation. No problems with blood count or liver function test for starting methotrexate as discussed.

## 2021-08-23 LAB — COMPLETE METABOLIC PANEL WITH GFR
AG Ratio: 1.6 (calc) (ref 1.0–2.5)
ALT: 25 U/L (ref 6–29)
AST: 18 U/L (ref 10–35)
Albumin: 4.6 g/dL (ref 3.6–5.1)
Alkaline phosphatase (APISO): 100 U/L (ref 37–153)
BUN: 16 mg/dL (ref 7–25)
CO2: 24 mmol/L (ref 20–32)
Calcium: 10 mg/dL (ref 8.6–10.4)
Chloride: 108 mmol/L (ref 98–110)
Creat: 0.7 mg/dL (ref 0.50–1.03)
Globulin: 2.9 g/dL (calc) (ref 1.9–3.7)
Glucose, Bld: 103 mg/dL — ABNORMAL HIGH (ref 65–99)
Potassium: 4.7 mmol/L (ref 3.5–5.3)
Sodium: 140 mmol/L (ref 135–146)
Total Bilirubin: 0.3 mg/dL (ref 0.2–1.2)
Total Protein: 7.5 g/dL (ref 6.1–8.1)
eGFR: 105 mL/min/{1.73_m2} (ref >=60)

## 2021-08-23 LAB — CBC WITH DIFFERENTIAL/PLATELET
Absolute Monocytes: 570 cells/uL (ref 200–950)
Basophils Absolute: 87 cells/uL (ref 0–200)
Basophils Relative: 1.3 %
Eosinophils Absolute: 436 cells/uL (ref 15–500)
Eosinophils Relative: 6.5 %
HCT: 41.1 % (ref 35.0–45.0)
Hemoglobin: 13.9 g/dL (ref 11.7–15.5)
Lymphs Abs: 3370 cells/uL (ref 850–3900)
MCH: 29.8 pg (ref 27.0–33.0)
MCHC: 33.8 g/dL (ref 32.0–36.0)
MCV: 88 fL (ref 80.0–100.0)
MPV: 10.4 fL (ref 7.5–12.5)
Monocytes Relative: 8.5 %
Neutro Abs: 2238 cells/uL (ref 1500–7800)
Neutrophils Relative %: 33.4 %
Platelets: 368 10*3/uL (ref 140–400)
RBC: 4.67 10*6/uL (ref 3.80–5.10)
RDW: 13.3 % (ref 11.0–15.0)
Total Lymphocyte: 50.3 %
WBC: 6.7 10*3/uL (ref 3.8–10.8)

## 2021-08-23 LAB — QUANTIFERON-TB GOLD PLUS
Mitogen-NIL: >10 IU/mL
NIL: 0.08 IU/mL
QuantiFERON-TB Gold Plus: NEGATIVE
TB1-NIL: 0 IU/mL
TB2-NIL: 0 IU/mL

## 2021-08-23 LAB — SEDIMENTATION RATE: Sed Rate: 22 mm/h — ABNORMAL HIGH (ref 0–20)

## 2021-08-23 LAB — C-REACTIVE PROTEIN: CRP: 2.8 mg/L (ref <8.0)

## 2021-08-23 LAB — HEPATITIS B CORE ANTIBODY, IGM: Hep B C IgM: NONREACTIVE

## 2021-08-23 LAB — HEPATITIS B SURFACE ANTIGEN: Hepatitis B Surface Ag: NONREACTIVE

## 2021-09-05 NOTE — Progress Notes (Signed)
Office Visit Note  Patient: Rachel Meza             Date of Birth: 24-Nov-1970           MRN: [id number]             PCP: Camillia Herter, NP Referring: Camillia Herter, NP Visit Date: 09/18/2021   Subjective:   History of Present Illness: Rachel Meza is a 51 y.o. female here for follow up for possible RA with bilateral hand pain positive RF and elevated sedimentation rate. So far she has not seen a large change in symptoms after starting methotrexate. She has not noticed any particular side effects from the medication. She still has symptoms worst in her hands, but feels like shoulders are stiff and sore especially in mornings as well.  Previous HPI 08/20/2021 Rachel Meza is a 51 y.o. female here for follow up for possible RA with bilateral hand pain positive RF and elevated sedimentation rate. Since our last visit symptoms remain similar or sightly worse over time. She has morning stiffness but now having more prolonged symptoms extending throughout the day as well. She is taking PRN tylenol more often due to joint pain.     Previous HPI 02/19/2021  Rachel Meza is a 51 y.o. female here for follow up with multiple joint and body pains with positive ANA, RF, and elevated ESR. Labs at initial visit showed low positive RNP 1.8 other ENA tests negative.  There have been no significant change in symptoms since our last visit.  She reports some episodic low back pain but not ongoing at this time.  No large increase in joint swelling erythema or range of motion change.   Previous HPI 02/02/21 Rachel Meza is a 51 y.o. female here for evaluation of generalized body pains with abnormal labs including positive ANA, positive RF, and elevated sedimentation rate. She started having pain and numbness symptoms in her bilateral hands since around 2008 with no specific onset or associated events that she can recall. This occasionally worsened with pain radiating up the arm and wrist as well.  She has noticed increase in pain in her shoulders and knees during the past few years also gradual onset and progression. She experiences some swelling in her feet and toes but not elsewhere at involved areas. The hands often also feel numb sometimes throughout sometimes limited to specific fingers. She occasionally loses her grip spontaneously but has normal grip strength much of the time.  She has not noticed any significant discoloration. She does not take any medications for these problems she is not sure what would be useful. She has no past major joint injury or surgery.   Labs reviewed 12/2020 ANA pos RF 70.9 ESR 56 CBC wnl CMP wnl Hgb A1c 6.8% HCV neg BV pos   Review of Systems  Constitutional:  Positive for fatigue.  HENT:  Negative for mouth dryness.   Eyes:  Positive for dryness.  Respiratory:  Negative for shortness of breath.   Cardiovascular:  Positive for swelling in legs/feet.  Gastrointestinal:  Positive for constipation.  Endocrine: Positive for cold intolerance, heat intolerance and increased urination.  Genitourinary:  Negative for difficulty urinating.  Musculoskeletal:  Positive for joint pain, gait problem, joint pain, joint swelling, muscle weakness, morning stiffness and muscle tenderness.  Skin:  Negative for rash.  Allergic/Immunologic: Negative for susceptible to infections.  Neurological:  Positive for numbness and weakness.  Hematological:  Negative for bruising/bleeding tendency.  Psychiatric/Behavioral:  Positive  for sleep disturbance.     PMFS History:  Patient Active Problem List   Diagnosis Date Noted   High risk medication use 08/20/2021   Seropositive rheumatoid arthritis ([place]) 02/02/2021   Positive ANA (antinuclear antibody) 02/02/2021   Diabetes ([place]) 12/28/2020   Bacterial vaginitis 12/28/2020   Cholecystitis 10/20/2018    Past Medical History:  Diagnosis Date   Blood transfusion without reported diagnosis    Diabetes mellitus without  complication ([place])    GERD (gastroesophageal reflux disease)    Hyperlipidemia     Family History  Problem Relation Age of Onset   Colon cancer Neg Hx    Colon polyps Neg Hx    Esophageal cancer Neg Hx    Rectal cancer Neg Hx    Stomach cancer Neg Hx    Past Surgical History:  Procedure Laterality Date   ABDOMINAL HYSTERECTOMY     CHOLECYSTECTOMY N/A 10/20/2018   Procedure: LAPAROSCOPIC CHOLECYSTECTOMY;  Surgeon: Mickeal Skinner, MD;  Location: [facility];  Service: General;  Laterality: N/A;   COLONOSCOPY     UTERINE FIBROID SURGERY     Social History   Social History Narrative   Not on file   Immunization History  Administered Date(s) Administered   Influenza-Unspecified 11/03/2020   Pneumococcal Polysaccharide-23 10/21/2018     Objective: Vital Signs: BP 128/79 (BP Location: Left Arm, Patient Position: Sitting, Cuff Size: Small)   Pulse 99   Resp 12   Ht '5\' 2"'  (1.575 m)   Wt 181 lb 9.6 oz (82.4 kg)   BMI 33.22 kg/m    Physical Exam Constitutional:      Appearance: She is obese.  Cardiovascular:     Rate and Rhythm: Regular rhythm.  Pulmonary:     Effort: Pulmonary effort is normal.     Breath sounds: Normal breath sounds.  Musculoskeletal:     Right lower leg: No edema.     Left lower leg: No edema.  Skin:    General: Skin is warm and dry.     Findings: No rash.  Neurological:     Mental Status: She is alert.  Psychiatric:        Mood and Affect: Mood normal.      Musculoskeletal Exam:  Neck full ROM no tenderness Shoulders full ROM no swelling, mild shoulder tenderness laterally Elbows full ROM no tenderness or swelling Wrists full ROM no tenderness or swelling Fingers slightly decreased flexion ROM in hands MCP joint swelling in at least 2nd-3rd digits b/l Knees full ROM no tenderness or swelling  CDAI Score: 13    Investigation: No additional findings.  Imaging: MM DIAG BREAST TOMO UNI RIGHT  Result Date: 09/14/2021 CLINICAL DATA:   51 year old female presenting for first six-month follow-up of a probably benign right breast asymmetry and probably benign, borderline right axillary lymphadenopathy. The patient was recently diagnosed with rheumatoid arthritis. EXAM: DIGITAL DIAGNOSTIC UNILATERAL RIGHT MAMMOGRAM WITH TOMOSYNTHESIS AND CAD; [place] AXILLARY RIGHT TECHNIQUE: Right digital diagnostic mammography and breast tomosynthesis was performed. The images were evaluated with computer-aided detection.; Targeted ultrasound examination of the right axilla was performed. COMPARISON:  Previous exam(s). ACR Breast Density Category b: There are scattered areas of fibroglandular density. FINDINGS: An asymmetry in the central right breast at mid depth on the MLO projection is no longer seen on today's mammographic views. Otherwise, no new or suspicious findings in the right breast. Targeted ultrasound is performed, showing stable appearance of borderline cortical thickening of multiple right axillary lymph nodes between 2-4  mm. Given the interval stability, this is felt to represent the normal appearance for the patient's lymph nodes. IMPRESSION: No mammographic or sonographic evidence of malignancy in the right breast. RECOMMENDATION: The patient may resume routine annual screening, due in November 2023. I have discussed the findings and recommendations with the patient. If applicable, a reminder letter will be sent to the patient regarding the next appointment. BI-RADS CATEGORY  2: Benign. Electronically Signed   By: Kristopher Oppenheim M.D.   On: 09/14/2021 09:49  [place] AXILLA RIGHT  Result Date: 09/14/2021 CLINICAL DATA:  51 year old female presenting for first six-month follow-up of a probably benign right breast asymmetry and probably benign, borderline right axillary lymphadenopathy. The patient was recently diagnosed with rheumatoid arthritis. EXAM: DIGITAL DIAGNOSTIC UNILATERAL RIGHT MAMMOGRAM WITH TOMOSYNTHESIS AND CAD; [place] AXILLARY RIGHT TECHNIQUE:  Right digital diagnostic mammography and breast tomosynthesis was performed. The images were evaluated with computer-aided detection.; Targeted ultrasound examination of the right axilla was performed. COMPARISON:  Previous exam(s). ACR Breast Density Category b: There are scattered areas of fibroglandular density. FINDINGS: An asymmetry in the central right breast at mid depth on the MLO projection is no longer seen on today's mammographic views. Otherwise, no new or suspicious findings in the right breast. Targeted ultrasound is performed, showing stable appearance of borderline cortical thickening of multiple right axillary lymph nodes between 2-4 mm. Given the interval stability, this is felt to represent the normal appearance for the patient's lymph nodes. IMPRESSION: No mammographic or sonographic evidence of malignancy in the right breast. RECOMMENDATION: The patient may resume routine annual screening, due in November 2023. I have discussed the findings and recommendations with the patient. If applicable, a reminder letter will be sent to the patient regarding the next appointment. BI-RADS CATEGORY  2: Benign. Electronically Signed   By: Kristopher Oppenheim M.D.   On: 09/14/2021 09:49   Recent Labs: Lab Results  Component Value Date   WBC 6.7 08/20/2021   HGB 13.9 08/20/2021   PLT 368 08/20/2021   NA 140 08/20/2021   K 4.7 08/20/2021   CL 108 08/20/2021   CO2 24 08/20/2021   GLUCOSE 103 (H) 08/20/2021   BUN 16 08/20/2021   CREATININE 0.70 08/20/2021   BILITOT 0.3 08/20/2021   ALKPHOS 120 12/27/2020   AST 18 08/20/2021   ALT 25 08/20/2021   PROT 7.5 08/20/2021   ALBUMIN 4.6 12/27/2020   CALCIUM 10.0 08/20/2021   GFRAA >60 10/19/2018   QFTBGOLDPLUS NEGATIVE 08/20/2021    Speciality Comments: No specialty comments available.  Procedures:  No procedures performed Allergies: Patient has no known allergies.   Assessment / Plan:     Visit Diagnoses: Seropositive rheumatoid arthritis  ([place]) - Plan: Sedimentation rate Positive ANA (antinuclear antibody) - Specific antibody markers for the positive ANA are negative and no new clinical signs for disease are present at this time.   Checking sed rate for disease activity monitoring. Physical exam looks about the same as last visit. Still with swelling in fingers of both hands but not very tender to pressure. Too early to say for methotrexate efficacy. Plan to continue MTX 15 mg PO weekly and folic acid 1 mg daily.  High risk medication use - methotrexate 15 mg p.o. weekly folic acid 1 mg daily. - Plan: CBC with Differential/Platelet, COMPLETE METABOLIC PANEL WITH GFR  Checking CBC and CMP for methotrexate toxicity monitoring. No reported side effects.  Orders: Orders Placed This Encounter  Procedures   Sedimentation rate   CBC  with Differential/Platelet   COMPLETE METABOLIC PANEL WITH GFR   No orders of the defined types were placed in this encounter.    Follow-Up Instructions: Return in about 10 weeks (around 11/27/2021) for RA on MTX f/u 2-3 mos.   Collier Salina, MD  Note - This record has been created using [employer].  Chart creation errors have been sought, but may not always  have been located. Such creation errors do not reflect on  the standard of medical care.

## 2021-09-14 ENCOUNTER — Ambulatory Visit
Admission: RE | Admit: 2021-09-14 | Discharge: 2021-09-14 | Disposition: A | Discharge status: Home / Self Care | Payer: 59 | Source: Ambulatory Visit | Attending: Family | Admitting: Family

## 2021-09-14 ENCOUNTER — Other Ambulatory Visit: Payer: Self-pay | Admitting: Family

## 2021-09-14 DIAGNOSIS — R928 Other abnormal and inconclusive findings on diagnostic imaging of breast: Secondary | ICD-10-CM

## 2021-09-14 DIAGNOSIS — N6489 Other specified disorders of breast: Secondary | ICD-10-CM | POA: Diagnosis not present

## 2021-09-18 ENCOUNTER — Encounter: Payer: Self-pay | Admitting: Internal Medicine

## 2021-09-18 ENCOUNTER — Ambulatory Visit (INDEPENDENT_AMBULATORY_CARE_PROVIDER_SITE_OTHER): Payer: 59 | Admitting: Internal Medicine

## 2021-09-18 VITALS — BP 128/79 | HR 99 | Resp 12 | Ht 62.0 in | Wt 181.6 lb

## 2021-09-18 DIAGNOSIS — R768 Other specified abnormal immunological findings in serum: Secondary | ICD-10-CM

## 2021-09-18 DIAGNOSIS — Z79899 Other long term (current) drug therapy: Secondary | ICD-10-CM | POA: Diagnosis not present

## 2021-09-18 DIAGNOSIS — M059 Rheumatoid arthritis with rheumatoid factor, unspecified: Secondary | ICD-10-CM | POA: Diagnosis not present

## 2021-09-19 LAB — COMPLETE METABOLIC PANEL WITH GFR
AG Ratio: 1.4 (calc) (ref 1.0–2.5)
ALT: 30 U/L — ABNORMAL HIGH (ref 6–29)
AST: 20 U/L (ref 10–35)
Albumin: 4.3 g/dL (ref 3.6–5.1)
Alkaline phosphatase (APISO): 104 U/L (ref 37–153)
BUN: 13 mg/dL (ref 7–25)
CO2: 24 mmol/L (ref 20–32)
Calcium: 9.6 mg/dL (ref 8.6–10.4)
Chloride: 107 mmol/L (ref 98–110)
Creat: 0.68 mg/dL (ref 0.50–1.03)
Globulin: 3.1 g/dL (calc) (ref 1.9–3.7)
Glucose, Bld: 100 mg/dL — ABNORMAL HIGH (ref 65–99)
Potassium: 4.5 mmol/L (ref 3.5–5.3)
Sodium: 140 mmol/L (ref 135–146)
Total Bilirubin: 0.3 mg/dL (ref 0.2–1.2)
Total Protein: 7.4 g/dL (ref 6.1–8.1)
eGFR: 106 mL/min/{1.73_m2} (ref >=60)

## 2021-09-19 LAB — CBC WITH DIFFERENTIAL/PLATELET
Absolute Monocytes: 511 cells/uL (ref 200–950)
Basophils Absolute: 50 cells/uL (ref 0–200)
Basophils Relative: 0.7 %
Eosinophils Absolute: 454 cells/uL (ref 15–500)
Eosinophils Relative: 6.4 %
HCT: 39.7 % (ref 35.0–45.0)
Hemoglobin: 13.5 g/dL (ref 11.7–15.5)
Lymphs Abs: 3181 cells/uL (ref 850–3900)
MCH: 31 pg (ref 27.0–33.0)
MCHC: 34 g/dL (ref 32.0–36.0)
MCV: 91.1 fL (ref 80.0–100.0)
MPV: 10.5 fL (ref 7.5–12.5)
Monocytes Relative: 7.2 %
Neutro Abs: 2904 cells/uL (ref 1500–7800)
Neutrophils Relative %: 40.9 %
Platelets: 330 10*3/uL (ref 140–400)
RBC: 4.36 10*6/uL (ref 3.80–5.10)
RDW: 13.8 % (ref 11.0–15.0)
Total Lymphocyte: 44.8 %
WBC: 7.1 10*3/uL (ref 3.8–10.8)

## 2021-09-19 LAB — SEDIMENTATION RATE: Sed Rate: 17 mm/h (ref 0–20)

## 2021-10-01 ENCOUNTER — Other Ambulatory Visit: Payer: Self-pay | Admitting: Family

## 2021-10-01 DIAGNOSIS — E119 Type 2 diabetes mellitus without complications: Secondary | ICD-10-CM

## 2021-10-12 ENCOUNTER — Other Ambulatory Visit: Payer: Self-pay | Admitting: Internal Medicine

## 2021-10-12 DIAGNOSIS — M79641 Pain in right hand: Secondary | ICD-10-CM

## 2021-10-12 NOTE — Telephone Encounter (Signed)
Next Visit: 12/13/2021  Last Visit: 09/18/2021  Last Fill: 08/20/2021  DX:  Seropositive rheumatoid arthritis   Current Dose per office note 09/18/2021: continue MTX 15 mg PO weekly   Labs: 09/18/2021 Glucose 100, ALT 30  Okay to refill MTX?

## 2021-11-12 ENCOUNTER — Other Ambulatory Visit: Payer: Self-pay | Admitting: Internal Medicine

## 2021-11-12 DIAGNOSIS — M79641 Pain in right hand: Secondary | ICD-10-CM

## 2021-12-12 NOTE — Progress Notes (Deleted)
Office Visit Note  Patient: Rachel Meza             Date of Birth: 11-Nov-1970           MRN: [id number]             PCP: Camillia Herter, NP Referring: Camillia Herter, NP Visit Date: 12/13/2021   Subjective:  No chief complaint on file.   History of Present Illness: Rachel Meza is a 51 y.o. female here for follow up for possible RA with bilateral hand pain positive RF and elevated sedimentation rate   Previous HPI 09/18/2021 Rachel Meza is a 51 y.o. female here for follow up for possible RA with bilateral hand pain positive RF and elevated sedimentation rate. So far she has not seen a large change in symptoms after starting methotrexate. She has not noticed any particular side effects from the medication. She still has symptoms worst in her hands, but feels like shoulders are stiff and sore especially in mornings as well.   Previous HPI 08/20/2021 Rachel Meza is a 51 y.o. female here for follow up for possible RA with bilateral hand pain positive RF and elevated sedimentation rate. Since our last visit symptoms remain similar or sightly worse over time. She has morning stiffness but now having more prolonged symptoms extending throughout the day as well. She is taking PRN tylenol more often due to joint pain.     Previous HPI 02/19/2021  Rachel Meza is a 51 y.o. female here for follow up with multiple joint and body pains with positive ANA, RF, and elevated ESR. Labs at initial visit showed low positive RNP 1.8 other ENA tests negative.  There have been no significant change in symptoms since our last visit.  She reports some episodic low back pain but not ongoing at this time.  No large increase in joint swelling erythema or range of motion change.   Previous HPI 02/02/21 Rachel Meza is a 51 y.o. female here for evaluation of generalized body pains with abnormal labs including positive ANA, positive RF, and elevated sedimentation rate. She started having pain and  numbness symptoms in her bilateral hands since around 2008 with no specific onset or associated events that she can recall. This occasionally worsened with pain radiating up the arm and wrist as well. She has noticed increase in pain in her shoulders and knees during the past few years also gradual onset and progression. She experiences some swelling in her feet and toes but not elsewhere at involved areas. The hands often also feel numb sometimes throughout sometimes limited to specific fingers. She occasionally loses her grip spontaneously but has normal grip strength much of the time.  She has not noticed any significant discoloration. She does not take any medications for these problems she is not sure what would be useful. She has no past major joint injury or surgery.   Labs reviewed 12/2020 ANA pos RF 70.9 ESR 56 CBC wnl CMP wnl Hgb A1c 6.8% HCV neg BV pos   No Rheumatology ROS completed.   PMFS History:  Patient Active Problem List   Diagnosis Date Noted   High risk medication use 08/20/2021   Seropositive rheumatoid arthritis ([place]) 02/02/2021   Positive ANA (antinuclear antibody) 02/02/2021   Diabetes ([place]) 12/28/2020   Bacterial vaginitis 12/28/2020   Cholecystitis 10/20/2018    Past Medical History:  Diagnosis Date   Blood transfusion without reported diagnosis    Diabetes mellitus without complication ([place])  GERD (gastroesophageal reflux disease)    Hyperlipidemia     Family History  Problem Relation Age of Onset   Colon cancer Neg Hx    Colon polyps Neg Hx    Esophageal cancer Neg Hx    Rectal cancer Neg Hx    Stomach cancer Neg Hx    Past Surgical History:  Procedure Laterality Date   ABDOMINAL HYSTERECTOMY     CHOLECYSTECTOMY N/A 10/20/2018   Procedure: LAPAROSCOPIC CHOLECYSTECTOMY;  Surgeon: Kinsinger, Arta Bruce, MD;  Location: [facility];  Service: General;  Laterality: N/A;   COLONOSCOPY     UTERINE FIBROID SURGERY     Social History   Social History  Narrative   Not on file   Immunization History  Administered Date(s) Administered   Influenza-Unspecified 11/03/2020   Pneumococcal Polysaccharide-23 10/21/2018     Objective: Vital Signs: There were no vitals taken for this visit.   Physical Exam   Musculoskeletal Exam: ***  CDAI Exam: CDAI Score: -- Patient Global: --; Provider Global: -- Swollen: --; Tender: -- Joint Exam 12/13/2021   No joint exam has been documented for this visit   There is currently no information documented on the homunculus. Go to the Rheumatology activity and complete the homunculus joint exam.  Investigation: No additional findings.  Imaging: No results found.  Recent Labs: Lab Results  Component Value Date   WBC 7.1 09/18/2021   HGB 13.5 09/18/2021   PLT 330 09/18/2021   NA 140 09/18/2021   K 4.5 09/18/2021   CL 107 09/18/2021   CO2 24 09/18/2021   GLUCOSE 100 (H) 09/18/2021   BUN 13 09/18/2021   CREATININE 0.68 09/18/2021   BILITOT 0.3 09/18/2021   ALKPHOS 120 12/27/2020   AST 20 09/18/2021   ALT 30 (H) 09/18/2021   PROT 7.4 09/18/2021   ALBUMIN 4.6 12/27/2020   CALCIUM 9.6 09/18/2021   GFRAA >60 10/19/2018   QFTBGOLDPLUS NEGATIVE 08/20/2021    Speciality Comments: No specialty comments available.  Procedures:  No procedures performed Allergies: Patient has no known allergies.   Assessment / Plan:     Visit Diagnoses: No diagnosis found.  ***  Orders: No orders of the defined types were placed in this encounter.  No orders of the defined types were placed in this encounter.    Follow-Up Instructions: No follow-ups on file.   Bertram Savin, RT  Note - This record has been created using [employer].  Chart creation errors have been sought, but may not always  have been located. Such creation errors do not reflect on  the standard of medical care.

## 2021-12-13 ENCOUNTER — Ambulatory Visit: Payer: 59 | Admitting: Internal Medicine

## 2021-12-16 ENCOUNTER — Other Ambulatory Visit: Payer: Self-pay | Admitting: Family

## 2021-12-16 DIAGNOSIS — E119 Type 2 diabetes mellitus without complications: Secondary | ICD-10-CM

## 2021-12-17 NOTE — Telephone Encounter (Signed)
Schedule appointment?

## 2021-12-28 NOTE — Progress Notes (Signed)
Office Visit Note  Patient: Rachel Meza             Date of Birth: 01-22-1971           MRN: [id number]             PCP: Camillia Herter, NP Referring: Camillia Herter, NP Visit Date: 01/11/2022   Subjective:  Follow-up (Patient states she is still having bilateral hand pain and swelling, pain in shoulders, and right hip pain. )   History of Present Illness: Rachel Meza is a 51 y.o. female here for follow up for seropositive RA on MTX 15 mg PO weekly and folic acid 1 mg daily. She is tolerating the medication fine but still has joint pain in multiple areas especially shoulders, right hand, and hip. She notices swelling in her right hand and gets swelling in feet with prolonged standing. Shoulder pain is worst on the tops of the shoulders often irritating around the bra strap area.  Previous HPI 09/18/2021 [name] is a 51 y.o. female here for follow up for possible RA with bilateral hand pain positive RF and elevated sedimentation rate. So far she has not seen a large change in symptoms after starting methotrexate. She has not noticed any particular side effects from the medication. She still has symptoms worst in her hands, but feels like shoulders are stiff and sore especially in mornings as well.   Previous HPI 08/20/2021 [name] is a 51 y.o. female here for follow up for possible RA with bilateral hand pain positive RF and elevated sedimentation rate. Since our last visit symptoms remain similar or sightly worse over time. She has morning stiffness but now having more prolonged symptoms extending throughout the day as well. She is taking PRN tylenol more often due to joint pain.     Previous HPI 02/19/2021  [name] is a 51 y.o. female here for follow up with multiple joint and body pains with positive ANA, RF, and elevated ESR. Labs at initial visit showed low positive RNP 1.8 other ENA tests negative.  There have been no significant change in symptoms since  our last visit.  She reports some episodic low back pain but not ongoing at this time.  No large increase in joint swelling erythema or range of motion change.   Previous HPI 02/02/21 [name] is a 51 y.o. female here for evaluation of generalized body pains with abnormal labs including positive ANA, positive RF, and elevated sedimentation rate. She started having pain and numbness symptoms in her bilateral hands since around 2008 with no specific onset or associated events that she can recall. This occasionally worsened with pain radiating up the arm and wrist as well. She has noticed increase in pain in her shoulders and knees during the past few years also gradual onset and progression. She experiences some swelling in her feet and toes but not elsewhere at involved areas. The hands often also feel numb sometimes throughout sometimes limited to specific fingers. She occasionally loses her grip spontaneously but has normal grip strength much of the time.  She has not noticed any significant discoloration. She does not take any medications for these problems she is not sure what would be useful. She has no past major joint injury or surgery.   Labs reviewed 12/2020 ANA pos RF 70.9 ESR 56 CBC wnl CMP wnl Hgb A1c 6.8% HCV neg BV pos   Review of Systems  Constitutional:  Positive for fatigue.  HENT:  Negative for mouth sores and mouth dryness.   Eyes:  Negative for dryness.  Respiratory:  Negative for shortness of breath.   Cardiovascular:  Positive for palpitations. Negative for chest pain.  Gastrointestinal:  Positive for diarrhea. Negative for blood in stool and constipation.  Endocrine: Negative for increased urination.  Genitourinary:  Negative for involuntary urination.  Musculoskeletal:  Positive for joint pain, joint pain, joint swelling, myalgias, muscle weakness, morning stiffness, muscle tenderness and myalgias. Negative for gait problem.  Skin:  Positive for rash. Negative  for color change, hair loss and sensitivity to sunlight.  Allergic/Immunologic: Negative for susceptible to infections.  Neurological:  Positive for headaches. Negative for dizziness.  Hematological:  Negative for swollen glands.  Psychiatric/Behavioral:  Positive for sleep disturbance. Negative for depressed mood. The patient is not nervous/anxious.     PMFS History:  Patient Active Problem List   Diagnosis Date Noted   High risk medication use 08/20/2021   Seropositive rheumatoid arthritis ([place]) 02/02/2021   Positive ANA (antinuclear antibody) 02/02/2021   Diabetes ([place]) 12/28/2020   Bacterial vaginitis 12/28/2020   Cholecystitis 10/20/2018    Past Medical History:  Diagnosis Date   Blood transfusion without reported diagnosis    Diabetes mellitus without complication ([place])    GERD (gastroesophageal reflux disease)    Hyperlipidemia     Family History  Problem Relation Age of Onset   Colon cancer Neg Hx    Colon polyps Neg Hx    Esophageal cancer Neg Hx    Rectal cancer Neg Hx    Stomach cancer Neg Hx    Past Surgical History:  Procedure Laterality Date   ABDOMINAL HYSTERECTOMY     CHOLECYSTECTOMY N/A 10/20/2018   Procedure: LAPAROSCOPIC CHOLECYSTECTOMY;  Surgeon: Mickeal Skinner, MD;  Location: [facility];  Service: General;  Laterality: N/A;   COLONOSCOPY     UTERINE FIBROID SURGERY     Social History   Social History Narrative   Not on file   Immunization History  Administered Date(s) Administered   Influenza-Unspecified 11/03/2020   Pneumococcal Polysaccharide-23 10/21/2018     Objective: Vital Signs: BP 115/77 (BP Location: Right Arm, Patient Position: Sitting, Cuff Size: Normal)   Meza (!) 102   Resp 14   Ht '5\' 2"'  (1.575 m)   Wt 177 lb 3.2 oz (80.4 kg)   BMI 32.41 kg/m    Physical Exam Constitutional:      Appearance: She is obese.  Cardiovascular:     Rate and Rhythm: Normal rate and regular rhythm.  Pulmonary:     Effort: Pulmonary effort  is normal.     Breath sounds: Normal breath sounds.  Musculoskeletal:     Right lower leg: No edema.     Left lower leg: No edema.  Skin:    General: Skin is warm and dry.  Neurological:     Mental Status: She is alert.  Psychiatric:        Mood and Affect: Mood normal.      Musculoskeletal Exam:  Neck full ROM no tenderness Shoulders full ROM tenderness to pressure superiorly worst around bra strap, some pain around coracoid process Elbows full ROM no tenderness or swelling Wrists full ROM no tenderness or swelling Fingers slightly limited flexion ROM and swelling 2nd-3rd MCP joints, right hand tenderness with full passive flexion, no warmth or erythema Knees full ROM no tenderness or swelling   CDAI Exam: CDAI Score: 14  Patient Global: 30 mm; Provider Global: 30 mm Swollen: 4 ;  Tender: 4  Joint Exam 01/11/2022      Right  Left  Glenohumeral   Tender   Tender  MCP 2  Swollen Tender  Swollen   MCP 3  Swollen Tender  Swollen      Investigation: No additional findings.  Imaging: No results found.  Recent Labs: Lab Results  Component Value Date   WBC 7.1 09/18/2021   HGB 13.5 09/18/2021   PLT 330 09/18/2021   NA 140 09/18/2021   K 4.5 09/18/2021   CL 107 09/18/2021   CO2 24 09/18/2021   GLUCOSE 100 (H) 09/18/2021   BUN 13 09/18/2021   CREATININE 0.68 09/18/2021   BILITOT 0.3 09/18/2021   ALKPHOS 120 12/27/2020   AST 20 09/18/2021   ALT 30 (H) 09/18/2021   PROT 7.4 09/18/2021   ALBUMIN 4.6 12/27/2020   CALCIUM 9.6 09/18/2021   GFRAA >60 10/19/2018   QFTBGOLDPLUS NEGATIVE 08/20/2021    Speciality Comments: No specialty comments available.  Procedures:  No procedures performed Allergies: Patient has no known allergies.   Assessment / Plan:     Visit Diagnoses: Seropositive rheumatoid arthritis ([place]) - Plan: Sedimentation rate  Symptoms not very large difference so far after methotrexate start. But tolerating okay. Checking ESR if this is improving  would be encouraging. If labs okay today recommend increase to 20 mg PO weekly and continue folic acid 1 mg daily.  High risk medication use - methotrexate 15 mg p.o. weekly  - Plan: CBC with Differential/Platelet, COMPLETE METABOLIC PANEL WITH GFR  Checking CBC and CMP for methotrexate medication monitoring.  Bilateral hand pain  Also has symptoms consistent with CTS numbness in fingers 1-3 with work and at night times. Likely coming from continued swelling. She has tried wrist braces without a large difference. Plan to keep trying to treat underlying inflammation may improve impingement.  Orders: Orders Placed This Encounter  Procedures   Sedimentation rate   CBC with Differential/Platelet   COMPLETE METABOLIC PANEL WITH GFR   No orders of the defined types were placed in this encounter.    Follow-Up Instructions: Return in about 3 months (around 04/13/2022) for RA on MTX f/u 14mo.   CCollier Salina MD  Note - This record has been created using [employer]  Chart creation errors have been sought, but may not always  have been located. Such creation errors do not reflect on  the standard of medical care.

## 2022-01-11 ENCOUNTER — Encounter: Payer: Self-pay | Admitting: Internal Medicine

## 2022-01-11 ENCOUNTER — Ambulatory Visit: Payer: 59 | Attending: Internal Medicine | Admitting: Internal Medicine

## 2022-01-11 VITALS — BP 115/77 | HR 102 | Resp 14 | Ht 62.0 in | Wt 177.2 lb

## 2022-01-11 DIAGNOSIS — M79641 Pain in right hand: Secondary | ICD-10-CM

## 2022-01-11 DIAGNOSIS — M059 Rheumatoid arthritis with rheumatoid factor, unspecified: Secondary | ICD-10-CM | POA: Diagnosis not present

## 2022-01-11 DIAGNOSIS — Z79899 Other long term (current) drug therapy: Secondary | ICD-10-CM | POA: Diagnosis not present

## 2022-01-11 DIAGNOSIS — R768 Other specified abnormal immunological findings in serum: Secondary | ICD-10-CM

## 2022-01-11 DIAGNOSIS — M79642 Pain in left hand: Secondary | ICD-10-CM | POA: Diagnosis not present

## 2022-01-12 LAB — COMPLETE METABOLIC PANEL WITH GFR
AG Ratio: 1.5 (calc) (ref 1.0–2.5)
ALT: 27 U/L (ref 6–29)
AST: 16 U/L (ref 10–35)
Albumin: 4.5 g/dL (ref 3.6–5.1)
Alkaline phosphatase (APISO): 91 U/L (ref 37–153)
BUN: 15 mg/dL (ref 7–25)
CO2: 25 mmol/L (ref 20–32)
Calcium: 10.1 mg/dL (ref 8.6–10.4)
Chloride: 107 mmol/L (ref 98–110)
Creat: 0.72 mg/dL (ref 0.50–1.03)
Globulin: 3.1 g/dL (calc) (ref 1.9–3.7)
Glucose, Bld: 96 mg/dL (ref 65–99)
Potassium: 4.2 mmol/L (ref 3.5–5.3)
Sodium: 142 mmol/L (ref 135–146)
Total Bilirubin: 0.3 mg/dL (ref 0.2–1.2)
Total Protein: 7.6 g/dL (ref 6.1–8.1)
eGFR: 102 mL/min/{1.73_m2} (ref >=60)

## 2022-01-12 LAB — CBC WITH DIFFERENTIAL/PLATELET
Absolute Monocytes: 706 cells/uL (ref 200–950)
Basophils Absolute: 94 cells/uL (ref 0–200)
Basophils Relative: 1.1 %
Eosinophils Absolute: 604 cells/uL — ABNORMAL HIGH (ref 15–500)
Eosinophils Relative: 7.1 %
HCT: 39.8 % (ref 35.0–45.0)
Hemoglobin: 13.7 g/dL (ref 11.7–15.5)
Lymphs Abs: 3902 cells/uL — ABNORMAL HIGH (ref 850–3900)
MCH: 31.1 pg (ref 27.0–33.0)
MCHC: 34.4 g/dL (ref 32.0–36.0)
MCV: 90.2 fL (ref 80.0–100.0)
MPV: 10 fL (ref 7.5–12.5)
Monocytes Relative: 8.3 %
Neutro Abs: 3196 cells/uL (ref 1500–7800)
Neutrophils Relative %: 37.6 %
Platelets: 360 10*3/uL (ref 140–400)
RBC: 4.41 10*6/uL (ref 3.80–5.10)
RDW: 13.1 % (ref 11.0–15.0)
Total Lymphocyte: 45.9 %
WBC: 8.5 10*3/uL (ref 3.8–10.8)

## 2022-01-12 LAB — SEDIMENTATION RATE: Sed Rate: 31 mm/h — ABNORMAL HIGH (ref 0–20)

## 2022-01-14 ENCOUNTER — Other Ambulatory Visit: Payer: Self-pay

## 2022-01-14 ENCOUNTER — Other Ambulatory Visit: Payer: Self-pay | Admitting: Internal Medicine

## 2022-01-14 DIAGNOSIS — M79641 Pain in right hand: Secondary | ICD-10-CM

## 2022-01-14 NOTE — Telephone Encounter (Signed)
Next Visit: 04/19/2022  Last Visit: 01/11/2022  Last Fill: 10/16/2021  DX: Seropositive rheumatoid arthritis   Current Dose per office note 01/14/2022: I recommend she try increasing the methotrexate to 8 tablets/week instead of 6 tablets to see if this provides additional benefit  Labs: 01/11/2022: Lab results look fine with no side effects from the methotrexate.  Her sedimentation rate is mildly increased at 31 this was normal 3 months ago.  I recommend she try increasing the methotrexate to 8 tablets/week instead of 6 tablets to see if this provides additional benefit.  Okay to refill methotrexate?

## 2022-01-14 NOTE — Telephone Encounter (Signed)
Next Visit: 04/19/2022  Last Visit: 01/11/2022  Last Fill: 10/16/2021  DX: Seropositive rheumatoid arthritis   Current Dose per office note 01/11/2022: 20 mg PO weekly   Labs: Lab results look fine with no side effects from the methotrexate.  Okay to refill methotrexate?

## 2022-01-14 NOTE — Progress Notes (Signed)
Lab results look fine with no side effects from the methotrexate.  Her sedimentation rate is mildly increased at 31 this was normal 3 months ago.  I recommend she try increasing the methotrexate to 8 tablets/week instead of 6 tablets to see if this provides additional benefit.

## 2022-02-13 ENCOUNTER — Other Ambulatory Visit: Payer: Self-pay | Admitting: Internal Medicine

## 2022-02-13 DIAGNOSIS — M79642 Pain in left hand: Secondary | ICD-10-CM

## 2022-02-13 NOTE — Telephone Encounter (Signed)
Next Visit: 04/19/2022   Last Visit: 01/11/2022   Last Fill: 11/12/2021  DX: Seropositive rheumatoid arthritis    Current Dose per office note 01/11/2022: folic acid 1 mg daily.   Okay to refill Folic Acid?

## 2022-03-01 ENCOUNTER — Other Ambulatory Visit: Payer: Self-pay | Admitting: Family

## 2022-03-01 DIAGNOSIS — E119 Type 2 diabetes mellitus without complications: Secondary | ICD-10-CM

## 2022-03-04 NOTE — Telephone Encounter (Signed)
Requested medication (s) are due for refill today: yes  Requested medication (s) are on the active medication list: yes  Last refill:  10/02/21  Future visit scheduled:no  Notes to clinic:  Unable to refill per protocol, courtesy refill already given, routing for provider approval.      Requested Prescriptions  Pending Prescriptions Disp Refills   metFORMIN (GLUCOPHAGE) 500 MG tablet [Pharmacy Med Name: METFORMIN HCL 500 MG TABLET] 60 tablet 2    Sig: TAKE 1 TABLET BY MOUTH TWICE A DAY WITH FOOD     Endocrinology:  Diabetes - Biguanides Failed - 03/01/2022  9:28 AM      Failed - HBA1C is between 0 and 7.9 and within 180 days    Hgb A1c MFr Bld  Date Value Ref Range Status  12/27/2020 6.8 (H) 4.8 - 5.6 % Final    Comment:             Prediabetes: 5.7 - 6.4          Diabetes: >6.4          Glycemic control for adults with diabetes: <7.0          Failed - B12 Level in normal range and within 720 days    No results found for: "VITAMINB12"       Failed - Valid encounter within last 6 months    Recent Outpatient Visits           1 year ago Annual physical exam   [facility] at [facility], [place], NP   1 year ago Encounter to establish care   Primary Care at [facility], Flonnie [place]ail[place], NP       Future Appointments             In 1 month Rice, Resa Miner, MD [place]            Passed - Cr in normal range and within 360 days    Creat  Date Value Ref Range Status  01/11/2022 0.72 0.50 - 1.03 mg/dL Final         Passed - eGFR in normal range and within 360 days    GFR calc Af Amer  Date Value Ref Range Status  10/19/2018 >60 >60 mL/min Final   GFR calc non Af Amer  Date Value Ref Range Status  10/19/2018 >60 >60 mL/min Final   eGFR  Date Value Ref Range Status  01/11/2022 102 > OR = 60 mL/min/1.47m Final  12/27/2020 109 >59 mL/min/1.73 Final         Passed - CBC within normal limits  and completed in the last 12 months    WBC  Date Value Ref Range Status  01/11/2022 8.5 3.8 - 10.8 Thousand/uL Final   RBC  Date Value Ref Range Status  01/11/2022 4.41 3.80 - 5.10 Million/uL Final   Hemoglobin  Date Value Ref Range Status  01/11/2022 13.7 11.7 - 15.5 g/dL Final  12/27/2020 13.5 11.1 - 15.9 g/dL Final   HCT  Date Value Ref Range Status  01/11/2022 39.8 35.0 - 45.0 % Final   Hematocrit  Date Value Ref Range Status  12/27/2020 39.7 34.0 - 46.6 % Final   MCHC  Date Value Ref Range Status  01/11/2022 34.4 32.0 - 36.0 g/dL Final   [facility] Date Value Ref Range Status  01/11/2022 31.1 27.0 - 33.0 pg Final   MCV  Date Value  Ref Range Status  01/11/2022 90.2 80.0 - 100.0 fL Final  12/27/2020 86 79 - 97 fL Final   No results found for: "PLTCOUNTKUC", "LABPLAT", "POCPLA" RDW  Date Value Ref Range Status  01/11/2022 13.1 11.0 - 15.0 % Final  12/27/2020 13.1 11.7 - 15.4 % Final

## 2022-04-18 NOTE — Progress Notes (Signed)
Office Visit Note  Patient: Rachel Meza             Date of Birth: September 08, 1970           MRN: [id number]             PCP: Rema Fendt, NP Referring: Rema Fendt, NP Visit Date: 04/19/2022   Subjective:  Follow-up   History of Present Illness: Rachel Meza is a 52 y.o. female here for follow up for seropositive RA on MTX increased to 20 mg PO weekly and folic acid 1 mg daily.  She is doing pretty well her arthritis symptoms have improved compared to our last visit.  She is also noticed a decrease in any of the visible swelling symptoms affecting her hand.  Morning stiffness is less than half an hour.  She has not noticed any trouble with taking the increased dose of medication.  Previous HPI 01/11/22 Rachel Meza is a 52 y.o. female here for follow up for seropositive RA on MTX 15 mg PO weekly and folic acid 1 mg daily. She is tolerating the medication fine but still has joint pain in multiple areas especially shoulders, right hand, and hip. She notices swelling in her right hand and gets swelling in feet with prolonged standing. Shoulder pain is worst on the tops of the shoulders often irritating around the bra strap area.   Previous HPI 09/18/2021 Rachel Meza is a 52 y.o. female here for follow up for possible RA with bilateral hand pain positive RF and elevated sedimentation rate. So far she has not seen a large change in symptoms after starting methotrexate. She has not noticed any particular side effects from the medication. She still has symptoms worst in her hands, but feels like shoulders are stiff and sore especially in mornings as well.   Previous HPI 08/20/2021 Rachel Meza is a 52 y.o. female here for follow up for possible RA with bilateral hand pain positive RF and elevated sedimentation rate. Since our last visit symptoms remain similar or sightly worse over time. She has morning stiffness but now having more prolonged symptoms extending throughout the day as  well. She is taking PRN tylenol more often due to joint pain.   Previous HPI 02/19/2021 Rachel Meza is a 52 y.o. female here for follow up with multiple joint and body pains with positive ANA, RF, and elevated ESR. Labs at initial visit showed low positive RNP 1.8 other ENA tests negative.  There have been no significant change in symptoms since our last visit.  She reports some episodic low back pain but not ongoing at this time.  No large increase in joint swelling erythema or range of motion change.   Previous HPI 02/02/21 Rachel Meza is a 52 y.o. female here for evaluation of generalized body pains with abnormal labs including positive ANA, positive RF, and elevated sedimentation rate. She started having pain and numbness symptoms in her bilateral hands since around 2008 with no specific onset or associated events that she can recall. This occasionally worsened with pain radiating up the arm and wrist as well. She has noticed increase in pain in her shoulders and knees during the past few years also gradual onset and progression. She experiences some swelling in her feet and toes but not elsewhere at involved areas. The hands often also feel numb sometimes throughout sometimes limited to specific fingers. She occasionally loses her grip spontaneously but has normal grip strength much of the time.  She has  not noticed any significant discoloration. She does not take any medications for these problems she is not sure what would be useful. She has no past major joint injury or surgery.   Labs reviewed 12/2020 ANA pos RF 70.9 ESR 56 CBC wnl CMP wnl Hgb A1c 6.8% HCV neg BV pos   Review of Systems  Constitutional:  Positive for fatigue.  HENT:  Negative for mouth sores and mouth dryness.   Eyes:  Negative for dryness.  Respiratory:  Positive for shortness of breath.   Cardiovascular:  Positive for palpitations. Negative for chest pain.  Gastrointestinal:  Negative for blood in stool,  constipation and diarrhea.  Endocrine: Negative for increased urination.  Genitourinary:  Negative for involuntary urination.  Musculoskeletal:  Positive for joint pain, joint pain, joint swelling, myalgias, muscle weakness, muscle tenderness and myalgias. Negative for gait problem.  Skin:  Negative for color change, rash, hair loss and sensitivity to sunlight.  Allergic/Immunologic: Negative for susceptible to infections.  Neurological:  Positive for dizziness and headaches.  Hematological:  Negative for swollen glands.  Psychiatric/Behavioral:  Negative for depressed mood and sleep disturbance. The patient is not nervous/anxious.     PMFS History:  Patient Active Problem List   Diagnosis Date Noted   High risk medication use 08/20/2021   Seropositive rheumatoid arthritis ([place]) 02/02/2021   Positive ANA (antinuclear antibody) 02/02/2021   Diabetes ([place]) 12/28/2020   Bacterial vaginitis 12/28/2020   Cholecystitis 10/20/2018    Past Medical History:  Diagnosis Date   Blood transfusion without reported diagnosis    Diabetes mellitus without complication ([place])    GERD (gastroesophageal reflux disease)    Hyperlipidemia     Family History  Problem Relation Age of Onset   Colon cancer Neg Hx    Colon polyps Neg Hx    Esophageal cancer Neg Hx    Rectal cancer Neg Hx    Stomach cancer Neg Hx    Past Surgical History:  Procedure Laterality Date   ABDOMINAL HYSTERECTOMY     CHOLECYSTECTOMY N/A 10/20/2018   Procedure: LAPAROSCOPIC CHOLECYSTECTOMY;  Surgeon: Mickeal Skinner, MD;  Location: [facility];  Service: General;  Laterality: N/A;   COLONOSCOPY     UTERINE FIBROID SURGERY     Social History   Social History Narrative   Not on file   Immunization History  Administered Date(s) Administered   Influenza-Unspecified 11/03/2020   Pneumococcal Polysaccharide-23 10/21/2018     Objective: Vital Signs: BP 130/83 (BP Location: Left Arm, Patient Position: Sitting, Cuff Size:  Normal)   Pulse 88   Ht 5\' 2"  (1.575 m)   Wt 181 lb (82.1 kg)   BMI 33.11 kg/m    Physical Exam Eyes:     Conjunctiva/sclera: Conjunctivae normal.  Cardiovascular:     Rate and Rhythm: Normal rate and regular rhythm.  Pulmonary:     Effort: Pulmonary effort is normal.     Breath sounds: Normal breath sounds.  Lymphadenopathy:     Cervical: No cervical adenopathy.  Skin:    General: Skin is warm and dry.  Neurological:     Mental Status: She is alert.  Psychiatric:        Mood and Affect: Mood normal.      Musculoskeletal Exam:  Neck full ROM no tenderness Shoulders full ROM stiffness reach overhead and mild lateral tenderness with no palpable swelling Elbows full ROM no tenderness or swelling Wrists full ROM no tenderness or swelling Fingers full ROM no tenderness or swelling Knees  full ROM no tenderness or swelling   CDAI Exam: CDAI Score: 5  Patient Global: 20 mm; Provider Global: 10 mm Swollen: 0 ; Tender: 2  Joint Exam 04/19/2022      Right  Left  Glenohumeral   Tender   Tender     Investigation: No additional findings.  Imaging: No results found.  Recent Labs: Lab Results  Component Value Date   WBC 6.6 04/19/2022   HGB 13.5 04/19/2022   PLT 348 04/19/2022   NA 142 04/19/2022   K 4.2 04/19/2022   CL 108 04/19/2022   CO2 23 04/19/2022   GLUCOSE 104 (H) 04/19/2022   BUN 13 04/19/2022   CREATININE 0.61 04/19/2022   BILITOT 0.3 04/19/2022   ALKPHOS 120 12/27/2020   AST 17 04/19/2022   ALT 26 04/19/2022   PROT 7.1 04/19/2022   ALBUMIN 4.6 12/27/2020   CALCIUM 9.7 04/19/2022   GFRAA >60 10/19/2018   QFTBGOLDPLUS NEGATIVE 08/20/2021    Speciality Comments: No specialty comments available.  Procedures:  No procedures performed Allergies: Patient has no known allergies.   Assessment / Plan:     Visit Diagnoses: Seropositive rheumatoid arthritis ([place]) - Plan: Sedimentation rate, methotrexate (RHEUMATREX) 2.5 MG tablet  Symptoms appear  significantly improved since her last visit and she is tolerating the higher dose of methotrexate without any noticeable side effect.  Still a bit of tenderness more in the proximal joints at this time no peripheral synovitis appreciated.  Will recheck sedimentation rate for disease activity assessment hopefully this has decreased as well.  Plan to continue methotrexate 20 mg p.o. weekly folic acid 1 mg daily.  High risk medication use - Plan: CBC with Differential/Platelet, COMPLETE METABOLIC PANEL WITH GFR  Checking CBC and CMP for methotrexate toxicity monitoring.  She has not suffered any major interval infections.  Orders: Orders Placed This Encounter  Procedures   Sedimentation rate   CBC with Differential/Platelet   COMPLETE METABOLIC PANEL WITH GFR   Meds ordered this encounter  Medications   methotrexate (RHEUMATREX) 2.5 MG tablet    Sig: Take 8 tablets (20 mg total) by mouth once a week. Caution:Chemotherapy. Protect from light.    Dispense:  40 tablet    Refill:  2     Follow-Up Instructions: Return in about 3 months (around 07/19/2022) for RA on MTX f/u 55mos.   Collier Salina, MD  Note - This record has been created using [employer].  Chart creation errors have been sought, but may not always  have been located. Such creation errors do not reflect on  the standard of medical care.

## 2022-04-19 ENCOUNTER — Encounter: Payer: Self-pay | Admitting: Internal Medicine

## 2022-04-19 ENCOUNTER — Ambulatory Visit: Payer: 59 | Attending: Internal Medicine | Admitting: Internal Medicine

## 2022-04-19 VITALS — BP 130/83 | HR 88 | Ht 62.0 in | Wt 181.0 lb

## 2022-04-19 DIAGNOSIS — M059 Rheumatoid arthritis with rheumatoid factor, unspecified: Secondary | ICD-10-CM

## 2022-04-19 DIAGNOSIS — Z79899 Other long term (current) drug therapy: Secondary | ICD-10-CM | POA: Diagnosis not present

## 2022-04-19 MED ORDER — METHOTREXATE SODIUM 2.5 MG PO TABS: 20.0000 mg | ORAL_TABLET | ORAL | 2 refills | Status: DC

## 2022-04-20 LAB — COMPLETE METABOLIC PANEL WITHOUT GFR
AG Ratio: 1.5 (calc) (ref 1.0–2.5)
ALT: 26 U/L (ref 6–29)
AST: 17 U/L (ref 10–35)
Albumin: 4.3 g/dL (ref 3.6–5.1)
Alkaline phosphatase (APISO): 81 U/L (ref 37–153)
BUN: 13 mg/dL (ref 7–25)
CO2: 23 mmol/L (ref 20–32)
Calcium: 9.7 mg/dL (ref 8.6–10.4)
Chloride: 108 mmol/L (ref 98–110)
Creat: 0.61 mg/dL (ref 0.50–1.03)
Globulin: 2.8 g/dL (ref 1.9–3.7)
Glucose, Bld: 104 mg/dL — ABNORMAL HIGH (ref 65–99)
Potassium: 4.2 mmol/L (ref 3.5–5.3)
Sodium: 142 mmol/L (ref 135–146)
Total Bilirubin: 0.3 mg/dL (ref 0.2–1.2)
Total Protein: 7.1 g/dL (ref 6.1–8.1)
eGFR: 108 mL/min/1.73m2 (ref >=60)

## 2022-04-20 LAB — CBC WITH DIFFERENTIAL/PLATELET
Absolute Monocytes: 469 {cells}/uL (ref 200–950)
Basophils Absolute: 59 {cells}/uL (ref 0–200)
Basophils Relative: 0.9 %
Eosinophils Absolute: 429 {cells}/uL (ref 15–500)
Eosinophils Relative: 6.5 %
HCT: 38.1 % (ref 35.0–45.0)
Hemoglobin: 13.5 g/dL (ref 11.7–15.5)
Lymphs Abs: 3524 {cells}/uL (ref 850–3900)
MCH: 31 pg (ref 27.0–33.0)
MCHC: 35.4 g/dL (ref 32.0–36.0)
MCV: 87.4 fL (ref 80.0–100.0)
MPV: 10.4 fL (ref 7.5–12.5)
Monocytes Relative: 7.1 %
Neutro Abs: 2119 {cells}/uL (ref 1500–7800)
Neutrophils Relative %: 32.1 %
Platelets: 348 Thousand/uL (ref 140–400)
RBC: 4.36 Million/uL (ref 3.80–5.10)
RDW: 12.7 % (ref 11.0–15.0)
Total Lymphocyte: 53.4 %
WBC: 6.6 Thousand/uL (ref 3.8–10.8)

## 2022-04-20 LAB — SEDIMENTATION RATE: Sed Rate: 22 mm/h (ref 0–30)

## 2022-04-22 NOTE — Progress Notes (Signed)
Labs look fine for continuing the current methotrexate dose. Her sedimentation rate improved from 31 to 22 probably reflecting decreased inflammation.

## 2022-04-25 ENCOUNTER — Other Ambulatory Visit: Payer: Self-pay | Admitting: Internal Medicine

## 2022-04-25 DIAGNOSIS — M059 Rheumatoid arthritis with rheumatoid factor, unspecified: Secondary | ICD-10-CM

## 2022-06-26 ENCOUNTER — Other Ambulatory Visit: Payer: Self-pay | Admitting: Internal Medicine

## 2022-06-26 DIAGNOSIS — M059 Rheumatoid arthritis with rheumatoid factor, unspecified: Secondary | ICD-10-CM

## 2022-06-26 NOTE — Telephone Encounter (Signed)
Last Fill: 02/18/2023  Labs: 04/19/2022 Labs look fine for continuing the current methotrexate dose. Her sedimentation rate improved from 31 to 22 probably reflecting decreased inflammation.   Next Visit: 07/19/2022  Last Visit: 04/19/2022  DX: Seropositive rheumatoid arthritis   Current Dose per office note 04/19/2022: methotrexate 20 mg p.o. weekly   Okay to refill Methotrexate?

## 2022-07-18 NOTE — Progress Notes (Signed)
Office Visit Note  Patient: Rachel Meza             Date of Birth: 02/13/1971           MRN: [id number]             PCP: Rema Fendt, NP Referring: Rema Fendt, NP Visit Date: 07/19/2022   Subjective:  Other (Patient reports pain in bilateral shoulders, left foot, right knee. Patient reports stiffness and swelling in bilateral hands. )   History of Present Illness: Rachel Meza is a 52 y.o. female here for follow up for seropositive Meza on methotrexate 20 mg p.o. weekly and folic acid 1 mg daily.  Since her last visit she has seen some increase in joint pain symptoms with more frequent pain and stiffness in her hands particularly first and second digits.  Occasionally gets numbness in the tips of the fingers.  Shoulder pain and stiffness remains about the same since last time some tenderness with pressure and gets pain when lifting and pulling.  Also noticing some right hip pain this is usually worse when first getting up from a seated position or if she lies on her side.  Also notices some increase in swelling of her feet gets toe pain usually worse at the left great toe.  Previous HPI 04/19/22 Rachel Meza is a 52 y.o. female here for follow up for seropositive Meza on MTX increased to 20 mg PO weekly and folic acid 1 mg daily.  She is doing pretty well her arthritis symptoms have improved compared to our last visit.  She is also noticed a decrease in any of the visible swelling symptoms affecting her hand.  Morning stiffness is less than half an hour.  She has not noticed any trouble with taking the increased dose of medication.   Previous HPI 01/11/22 Rachel Meza is a 52 y.o. female here for follow up for seropositive Meza on MTX 15 mg PO weekly and folic acid 1 mg daily. She is tolerating the medication fine but still has joint pain in multiple areas especially shoulders, right hand, and hip. She notices swelling in her right hand and gets swelling in feet with prolonged  standing. Shoulder pain is worst on the tops of the shoulders often irritating around the bra strap area.   Previous HPI 09/18/2021 Rachel Meza is a 52 y.o. female here for follow up for possible Meza with bilateral hand pain positive RF and elevated sedimentation rate. So far she has not seen a large change in symptoms after starting methotrexate. She has not noticed any particular side effects from the medication. She still has symptoms worst in her hands, but feels like shoulders are stiff and sore especially in mornings as well.   Previous HPI 08/20/2021 Rachel Meza is a 52 y.o. female here for follow up for possible Meza with bilateral hand pain positive RF and elevated sedimentation rate. Since our last visit symptoms remain similar or sightly worse over time. She has morning stiffness but now having more prolonged symptoms extending throughout the day as well. She is taking PRN tylenol more often due to joint pain.   Previous HPI 02/19/2021 Rachel Meza is a 52 y.o. female here for follow up with multiple joint and body pains with positive ANA, RF, and elevated ESR. Labs at initial visit showed low positive RNP 1.8 other ENA tests negative.  There have been no significant change in symptoms since our last visit.  She reports some episodic low  back pain but not ongoing at this time.  No large increase in joint swelling erythema or range of motion change.   Previous HPI 02/02/21 Rachel Meza is a 52 y.o. female here for evaluation of generalized body pains with abnormal labs including positive ANA, positive RF, and elevated sedimentation rate. She started having pain and numbness symptoms in her bilateral hands since around 2008 with no specific onset or associated events that she can recall. This occasionally worsened with pain radiating up the arm and wrist as well. She has noticed increase in pain in her shoulders and knees during the past few years also gradual onset and progression. She  experiences some swelling in her feet and toes but not elsewhere at involved areas. The hands often also feel numb sometimes throughout sometimes limited to specific fingers. She occasionally loses her grip spontaneously but has normal grip strength much of the time.  She has not noticed any significant discoloration. She does not take any medications for these problems she is not sure what would be useful. She has no past major joint injury or surgery.   Labs reviewed 12/2020 ANA pos RF 70.9 ESR 56 CBC wnl CMP wnl Hgb A1c 6.8% HCV neg BV pos   Review of Systems  Constitutional:  Positive for fatigue.  HENT:  Negative for mouth sores and mouth dryness.   Eyes:  Negative for dryness.  Respiratory:  Positive for shortness of breath.   Cardiovascular:  Negative for chest pain and palpitations.  Gastrointestinal:  Positive for constipation. Negative for blood in stool and diarrhea.  Endocrine: Negative for increased urination.  Genitourinary:  Negative for involuntary urination.  Musculoskeletal:  Positive for joint pain, joint pain, joint swelling, myalgias, muscle weakness, morning stiffness, muscle tenderness and myalgias. Negative for gait problem.  Skin:  Positive for hair loss and sensitivity to sunlight. Negative for color change and rash.  Allergic/Immunologic: Negative for susceptible to infections.  Neurological:  Positive for headaches. Negative for dizziness.  Hematological:  Positive for swollen glands.  Psychiatric/Behavioral:  Positive for sleep disturbance. Negative for depressed mood. The patient is not nervous/anxious.     PMFS History:  Patient Active Problem List   Diagnosis Date Noted   Pain in right hip 07/19/2022   High risk medication use 08/20/2021   Seropositive rheumatoid arthritis 02/02/2021   Positive ANA (antinuclear antibody) 02/02/2021   Diabetes 12/28/2020   Bacterial vaginitis 12/28/2020   Cholecystitis 10/20/2018    Past Medical History:   Diagnosis Date   Blood transfusion without reported diagnosis    Diabetes mellitus without complication    GERD (gastroesophageal reflux disease)    Hyperlipidemia     Family History  Problem Relation Age of Onset   Colon cancer Neg Hx    Colon polyps Neg Hx    Esophageal cancer Neg Hx    Rectal cancer Neg Hx    Stomach cancer Neg Hx    Past Surgical History:  Procedure Laterality Date   ABDOMINAL HYSTERECTOMY     CHOLECYSTECTOMY N/A 10/20/2018   Procedure: LAPAROSCOPIC CHOLECYSTECTOMY;  Surgeon: Kinsinger, De BlanchLuke Aaron, MD;  Location: [facility] OR;  Service: General;  Laterality: N/A;   COLONOSCOPY     UTERINE FIBROID SURGERY     Social History   Social History Narrative   Not on file   Immunization History  Administered Date(s) Administered   Influenza-Unspecified 11/03/2020   Pneumococcal Polysaccharide-23 10/21/2018     Objective: Vital Signs: BP 106/71 (BP Location: Left Arm, Patient Position:  Sitting, Cuff Size: Large)   Pulse 90   Resp 17   Ht 5\' 2"  (1.575 m)   Wt 184 lb 3.2 oz (83.6 kg)   BMI 33.69 kg/m    Physical Exam Constitutional:      Appearance: She is obese.  Cardiovascular:     Rate and Rhythm: Normal rate and regular rhythm.  Pulmonary:     Effort: Pulmonary effort is normal.     Breath sounds: Normal breath sounds.  Musculoskeletal:     Right lower leg: No edema.     Left lower leg: No edema.  Skin:    General: Skin is warm and dry.     Findings: No rash.  Neurological:     Mental Status: She is alert.  Psychiatric:        Mood and Affect: Mood normal.      Musculoskeletal Exam:  Shoulder tenderness to pressure on superior and lateral sides joints, some pain with full active overhead abduction but range of motion is normal, no palpable swelling Elbows full ROM no tenderness or swelling Wrists full ROM no tenderness or swelling Fingers full ROM pain at first and second MCPs and second PIP joints more severe with flexion range of motion,  minimal tenderness to palpation Right lateral hip tenderness to pressure and some pain provoked with FADIR, normal range of motion Knees full ROM no tenderness or swelling Ankles full ROM no tenderness or swelling To medial deviation of fifth digits, some first MTP bone spurring with decreased dorsiflexion range of motion no palpable swelling   CDAI Exam: CDAI Score: 14  Patient Global: 40 mm; Provider Global: 20 mm Swollen: 0 ; Tender: 9  Joint Exam 07/19/2022      Right  Left  Glenohumeral   Tender   Tender  MCP 1   Tender   Tender  MCP 2   Tender   Tender  PIP 2   Tender   Tender  MTP 1      Tender     Investigation: No additional findings.  Imaging: No results found.  Recent Labs: Lab Results  Component Value Date   WBC 6.6 04/19/2022   HGB 13.5 04/19/2022   PLT 348 04/19/2022   NA 142 04/19/2022   K 4.2 04/19/2022   CL 108 04/19/2022   CO2 23 04/19/2022   GLUCOSE 104 (H) 04/19/2022   BUN 13 04/19/2022   CREATININE 0.61 04/19/2022   BILITOT 0.3 04/19/2022   ALKPHOS 120 12/27/2020   AST 17 04/19/2022   ALT 26 04/19/2022   PROT 7.1 04/19/2022   ALBUMIN 4.6 12/27/2020   CALCIUM 9.7 04/19/2022   GFRAA >60 10/19/2018   QFTBGOLDPLUS NEGATIVE 08/20/2021    Speciality Comments: No specialty comments available.  Procedures:  No procedures performed Allergies: Patient has no known allergies.   Assessment / Plan:     Visit Diagnoses: Seropositive rheumatoid arthritis - Plan: Sedimentation rate, C-reactive protein  Reported increased symptoms in multiple areas though no peripheral synovitis appreciable on physical exam today.  Rechecking sedimentation rate and CRP for disease activity monitoring.  Discussed if there is some other measurable evidence of increased inflammation could consider addition of second drug such as hydroxychloroquine 200 mg daily.  Alternatively could consider more symptomatic management or physical therapy.  Would be hesitant for any  biologic therapy with comorbidities.  Plan to continue methotrexate 20 mg p.o. weekly and folic acid 1 mg daily.  High risk medication use - Plan: CBC with Differential/Platelet,  COMPLETE METABOLIC PANEL WITH GFR  Checking CBC and CMP for medication monitoring on continued methotrexate treatment.  She has not had any interval serious infections.  Has noticed some increase with hair thinning or fragility not sure if this changed after the methotrexate dose titration.  Pain in right hip  Right hip pain in a lateral distribution discussed this is usually consistent with bursitis or gluteal tendinopathy problem more than articular disease.  Does not seem severe enough to require additional medication today.  Provided some printed range of motion and strengthening exercises to try as for step.  Orders: Orders Placed This Encounter  Procedures   Sedimentation rate   CBC with Differential/Platelet   COMPLETE METABOLIC PANEL WITH GFR   C-reactive protein   No orders of the defined types were placed in this encounter.    Follow-Up Instructions: Return in about 3 months (around 10/18/2022) for Meza on MTX f/u 80mos.   Fuller Plan, MD  Note - This record has been created using [employer].  Chart creation errors have been sought, but may not always  have been located. Such creation errors do not reflect on  the standard of medical care.

## 2022-07-19 ENCOUNTER — Ambulatory Visit: Payer: 59 | Attending: Internal Medicine | Admitting: Internal Medicine

## 2022-07-19 ENCOUNTER — Encounter: Payer: Self-pay | Admitting: Internal Medicine

## 2022-07-19 VITALS — BP 106/71 | HR 90 | Resp 17 | Ht 62.0 in | Wt 184.2 lb

## 2022-07-19 DIAGNOSIS — R768 Other specified abnormal immunological findings in serum: Secondary | ICD-10-CM

## 2022-07-19 DIAGNOSIS — M059 Rheumatoid arthritis with rheumatoid factor, unspecified: Secondary | ICD-10-CM

## 2022-07-19 DIAGNOSIS — M25551 Pain in right hip: Secondary | ICD-10-CM

## 2022-07-19 DIAGNOSIS — Z79899 Other long term (current) drug therapy: Secondary | ICD-10-CM | POA: Diagnosis not present

## 2022-07-19 MED ORDER — METHOTREXATE SODIUM 2.5 MG PO TABS: 20.0000 mg | ORAL_TABLET | ORAL | 0 refills | Status: DC

## 2022-07-19 NOTE — Addendum Note (Signed)
Addended by: Fuller Plan on: 07/19/2022 11:02 AM   Modules accepted: Orders

## 2022-07-21 LAB — COMPLETE METABOLIC PANEL WITH GFR
AG Ratio: 1.4 (calc) (ref 1.0–2.5)
ALT: 19 U/L (ref 6–29)
AST: 15 U/L (ref 10–35)
Albumin: 4.3 g/dL (ref 3.6–5.1)
Alkaline phosphatase (APISO): 94 U/L (ref 37–153)
BUN: 15 mg/dL (ref 7–25)
CO2: 26 mmol/L (ref 20–32)
Calcium: 9.8 mg/dL (ref 8.6–10.4)
Chloride: 107 mmol/L (ref 98–110)
Creat: 0.62 mg/dL (ref 0.50–1.03)
Globulin: 3 g/dL (calc) (ref 1.9–3.7)
Glucose, Bld: 101 mg/dL — ABNORMAL HIGH (ref 65–99)
Potassium: 4.3 mmol/L (ref 3.5–5.3)
Sodium: 141 mmol/L (ref 135–146)
Total Bilirubin: 0.3 mg/dL (ref 0.2–1.2)
Total Protein: 7.3 g/dL (ref 6.1–8.1)
eGFR: 108 mL/min/{1.73_m2} (ref >=60)

## 2022-07-21 LAB — CBC WITH DIFFERENTIAL/PLATELET
Absolute Monocytes: 528 cells/uL (ref 200–950)
Basophils Absolute: 73 cells/uL (ref 0–200)
Basophils Relative: 1.1 %
Eosinophils Absolute: 403 cells/uL (ref 15–500)
Eosinophils Relative: 6.1 %
HCT: 38.7 % (ref 35.0–45.0)
Hemoglobin: 13 g/dL (ref 11.7–15.5)
Lymphs Abs: 3155 cells/uL (ref 850–3900)
MCH: 29.7 pg (ref 27.0–33.0)
MCHC: 33.6 g/dL (ref 32.0–36.0)
MCV: 88.4 fL (ref 80.0–100.0)
MPV: 10.1 fL (ref 7.5–12.5)
Monocytes Relative: 8 %
Neutro Abs: 2442 cells/uL (ref 1500–7800)
Neutrophils Relative %: 37 %
Platelets: 356 10*3/uL (ref 140–400)
RBC: 4.38 10*6/uL (ref 3.80–5.10)
RDW: 13.7 % (ref 11.0–15.0)
Total Lymphocyte: 47.8 %
WBC: 6.6 10*3/uL (ref 3.8–10.8)

## 2022-07-21 LAB — C-REACTIVE PROTEIN: CRP: 5 mg/L (ref <8.0)

## 2022-07-21 LAB — SEDIMENTATION RATE: Sed Rate: 25 mm/h (ref 0–30)

## 2022-07-22 NOTE — Progress Notes (Signed)
Lab results look fine her sedimentation rate is 25 and CRP is 5 which are both normal.  Blood count and metabolic panel are completely normal so no problems with methotrexate. With her lab results normal and not too much swelling on the exam I do not recommend adding an extra RA medication at this time.  If shoulder or hip pains are not getting any better we could add another short-term anti-inflammatory medication or refer to physical therapy.

## 2022-08-06 DIAGNOSIS — N760 Acute vaginitis: Secondary | ICD-10-CM | POA: Diagnosis not present

## 2022-08-06 DIAGNOSIS — R350 Frequency of micturition: Secondary | ICD-10-CM | POA: Diagnosis not present

## 2022-08-06 DIAGNOSIS — N898 Other specified noninflammatory disorders of vagina: Secondary | ICD-10-CM | POA: Diagnosis not present

## 2022-08-06 DIAGNOSIS — R300 Dysuria: Secondary | ICD-10-CM | POA: Diagnosis not present

## 2022-08-06 DIAGNOSIS — Z6833 Body mass index (BMI) 33.0-33.9, adult: Secondary | ICD-10-CM | POA: Diagnosis not present

## 2022-10-08 ENCOUNTER — Other Ambulatory Visit: Payer: Self-pay | Admitting: Internal Medicine

## 2022-10-08 DIAGNOSIS — M059 Rheumatoid arthritis with rheumatoid factor, unspecified: Secondary | ICD-10-CM

## 2022-10-08 NOTE — Telephone Encounter (Signed)
Last Fill: 07/19/2022  Labs: 07/19/2022 Lab results look fine her sedimentation rate is 25 and CRP is 5 which are both normal.  Blood count and metabolic panel are completely normal so no problems with methotrexate.   Next Visit: 10/18/2022  Last Visit: 07/19/2022  DX: Seropositive rheumatoid arthritis   Current Dose per office note 07/19/2022: methotrexate 20 mg p.o. weekly   Okay to refill Methotrexate?

## 2022-10-17 NOTE — Progress Notes (Signed)
Office Visit Note  Patient: Rachel Meza             Date of Birth: 02-24-1971           MRN: [id number]             PCP: Rema Fendt, NP Referring: Rema Fendt, NP Visit Date: 10/18/2022   Subjective:  Other (Patient reports joint pain and swelling are stable)   History of Present Illness: Tehani Mersman is a 52 y.o. female here for follow up for seropositive RA on methotrexate 20 mg p.o. weekly folic acid 1 mg daily.  Currently joint pain and stiffness has been pretty stable does not experience any major flareups and is not seeing a lot of visible swelling recently.  Still has some pain and stiffness in her hands with occasional numbness but not having any trouble with grip or dropping items.  Has some chronic pain across the upper back between her neck and shoulders.  Also with ongoing right hip pain sometimes radiating most often bothers her if she lies on the right side.  Previous HPI 07/19/22 Kiyara Bouffard is a 52 y.o. female here for follow up for seropositive RA on methotrexate 20 mg p.o. weekly and folic acid 1 mg daily.  Since her last visit she has seen some increase in joint pain symptoms with more frequent pain and stiffness in her hands particularly first and second digits.  Occasionally gets numbness in the tips of the fingers.  Shoulder pain and stiffness remains about the same since last time some tenderness with pressure and gets pain when lifting and pulling.  Also noticing some right hip pain this is usually worse when first getting up from a seated position or if she lies on her side.  Also notices some increase in swelling of her feet gets toe pain usually worse at the left great toe.   Previous HPI 04/19/22 Ahlivia Salahuddin is a 52 y.o. female here for follow up for seropositive RA on MTX increased to 20 mg PO weekly and folic acid 1 mg daily.  She is doing pretty well her arthritis symptoms have improved compared to our last visit.  She is also noticed a decrease  in any of the visible swelling symptoms affecting her hand.  Morning stiffness is less than half an hour.  She has not noticed any trouble with taking the increased dose of medication.   Previous HPI 01/11/22 Christyanna Mckeon is a 52 y.o. female here for follow up for seropositive RA on MTX 15 mg PO weekly and folic acid 1 mg daily. She is tolerating the medication fine but still has joint pain in multiple areas especially shoulders, right hand, and hip. She notices swelling in her right hand and gets swelling in feet with prolonged standing. Shoulder pain is worst on the tops of the shoulders often irritating around the bra strap area.   Previous HPI 09/18/2021 Brightyn Mozer is a 52 y.o. female here for follow up for possible RA with bilateral hand pain positive RF and elevated sedimentation rate. So far she has not seen a large change in symptoms after starting methotrexate. She has not noticed any particular side effects from the medication. She still has symptoms worst in her hands, but feels like shoulders are stiff and sore especially in mornings as well.   Previous HPI 08/20/2021 Delvina Mizzell is a 52 y.o. female here for follow up for possible RA with bilateral hand pain positive RF and elevated  sedimentation rate. Since our last visit symptoms remain similar or sightly worse over time. She has morning stiffness but now having more prolonged symptoms extending throughout the day as well. She is taking PRN tylenol more often due to joint pain.   Previous HPI 02/19/2021 Kitt Minardi is a 52 y.o. female here for follow up with multiple joint and body pains with positive ANA, RF, and elevated ESR. Labs at initial visit showed low positive RNP 1.8 other ENA tests negative.  There have been no significant change in symptoms since our last visit.  She reports some episodic low back pain but not ongoing at this time.  No large increase in joint swelling erythema or range of motion change.   Previous  HPI 02/02/21 Anique Beckley is a 52 y.o. female here for evaluation of generalized body pains with abnormal labs including positive ANA, positive RF, and elevated sedimentation rate. She started having pain and numbness symptoms in her bilateral hands since around 2008 with no specific onset or associated events that she can recall. This occasionally worsened with pain radiating up the arm and wrist as well. She has noticed increase in pain in her shoulders and knees during the past few years also gradual onset and progression. She experiences some swelling in her feet and toes but not elsewhere at involved areas. The hands often also feel numb sometimes throughout sometimes limited to specific fingers. She occasionally loses her grip spontaneously but has normal grip strength much of the time.  She has not noticed any significant discoloration. She does not take any medications for these problems she is not sure what would be useful. She has no past major joint injury or surgery.   Labs reviewed 12/2020 ANA pos RF 70.9 ESR 56 CBC wnl CMP wnl Hgb A1c 6.8% HCV neg BV pos   Review of Systems  Constitutional:  Positive for fatigue.  HENT:  Negative for mouth sores and mouth dryness.   Eyes:  Positive for photophobia. Negative for dryness.  Respiratory:  Positive for shortness of breath.   Cardiovascular:  Positive for chest pain and palpitations.  Gastrointestinal:  Positive for constipation. Negative for blood in stool and diarrhea.  Endocrine: Positive for increased urination.  Genitourinary:  Positive for involuntary urination.  Musculoskeletal:  Positive for joint pain, gait problem, joint pain, joint swelling, myalgias, muscle weakness, morning stiffness, muscle tenderness and myalgias.  Skin:  Negative for color change, rash and sensitivity to sunlight.  Allergic/Immunologic: Negative for susceptible to infections.  Neurological:  Positive for headaches. Negative for dizziness.   Hematological:  Negative for swollen glands.  Psychiatric/Behavioral:  Positive for sleep disturbance. Negative for depressed mood. The patient is not nervous/anxious.     PMFS History:  Patient Active Problem List   Diagnosis Date Noted   Upper back pain 10/18/2022   Pain in right hip 07/19/2022   High risk medication use 08/20/2021   Seropositive rheumatoid arthritis (HCC) 02/02/2021   Positive ANA (antinuclear antibody) 02/02/2021   Diabetes (HCC) 12/28/2020   Bacterial vaginitis 12/28/2020   Cholecystitis 10/20/2018    Past Medical History:  Diagnosis Date   Blood transfusion without reported diagnosis    Diabetes mellitus without complication (HCC)    GERD (gastroesophageal reflux disease)    Hyperlipidemia     Family History  Problem Relation Age of Onset   Colon cancer Neg Hx    Colon polyps Neg Hx    Esophageal cancer Neg Hx    Rectal cancer Neg Hx  Stomach cancer Neg Hx    Past Surgical History:  Procedure Laterality Date   ABDOMINAL HYSTERECTOMY     CHOLECYSTECTOMY N/A 10/20/2018   Procedure: LAPAROSCOPIC CHOLECYSTECTOMY;  Surgeon: Kinsinger, De Blanch, MD;  Location: [facility] OR;  Service: General;  Laterality: N/A;   COLONOSCOPY     UTERINE FIBROID SURGERY     Social History   Social History Narrative   Not on file   Immunization History  Administered Date(s) Administered   Influenza-Unspecified 11/03/2020   Pneumococcal Polysaccharide-23 10/21/2018     Objective: Vital Signs: BP 113/77 (BP Location: Left Arm, Patient Position: Sitting, Cuff Size: Normal)   Pulse 82   Resp 17   Ht 5\' 2"  (1.575 m)   Wt 180 lb 3.2 oz (81.7 kg)   BMI 32.96 kg/m    Physical Exam Eyes:     Conjunctiva/sclera: Conjunctivae normal.  Cardiovascular:     Rate and Rhythm: Normal rate and regular rhythm.  Pulmonary:     Effort: Pulmonary effort is normal.     Breath sounds: Normal breath sounds.  Lymphadenopathy:     Cervical: No cervical adenopathy.  Skin:     General: Skin is warm and dry.     Findings: No rash.  Neurological:     Mental Status: She is alert.  Psychiatric:        Mood and Affect: Mood normal.      Musculoskeletal Exam:  Neck full ROM no tenderness Shoulders full ROM no tenderness or swelling There is tenderness to pressure across the trapezius muscles with increased muscle tone, no radiation Elbows full ROM no tenderness or swelling Wrists full ROM no tenderness or swelling Fingers full ROM mild tenderness to pressure for second MCP joint without palpable swelling Right lateral hip tenderness to pressure at greater trochanter and posterior over gluteal muscles, no radiation down the leg, normal internal and external rotation Knees full ROM no tenderness or swelling   CDAI Exam: CDAI Score: 4  Patient Global: 10 / 100; Provider Global: 10 / 100 Swollen: 0 ; Tender: 2  Joint Exam 10/18/2022      Right  Left  MCP 2   Tender     MCP 3   Tender        Investigation: No additional findings.  Imaging: No results found.  Recent Labs: Lab Results  Component Value Date   WBC 6.6 07/19/2022   HGB 13.0 07/19/2022   PLT 356 07/19/2022   NA 141 07/19/2022   K 4.3 07/19/2022   CL 107 07/19/2022   CO2 26 07/19/2022   GLUCOSE 101 (H) 07/19/2022   BUN 15 07/19/2022   CREATININE 0.62 07/19/2022   BILITOT 0.3 07/19/2022   ALKPHOS 120 12/27/2020   AST 15 07/19/2022   ALT 19 07/19/2022   PROT 7.3 07/19/2022   ALBUMIN 4.6 12/27/2020   CALCIUM 9.8 07/19/2022   GFRAA >60 10/19/2018   QFTBGOLDPLUS NEGATIVE 08/20/2021    Speciality Comments: No specialty comments available.  Procedures:  No procedures performed Allergies: Patient has no known allergies.   Assessment / Plan:     Visit Diagnoses: Seropositive rheumatoid arthritis (HCC) - Plan: Sedimentation rate  Inflammatory arthritis appears well-controlled with no peripheral joint synovitis and not many tender joints on exam today.  Will recheck sedimentation  rate for disease activity monitoring.  Plan to continue on methotrexate 20 mg p.o. weekly folic acid 1 mg daily.  High risk medication use - Plan: CBC with Differential/Platelet, COMPLETE METABOLIC PANEL WITH  GFR  Checking CBC and CMP for medication monitoring on continued use of methotrexate.  No medication intolerance reported.  No serious interval infections.  Pain in right hip - Plan: cyclobenzaprine (FLEXERIL) 5 MG tablet  Discussed right hip pain suspect more related to lateral hip tendinopathy versus true bursitis.  Not bad enough that she wants to prioritize cost and time commitment for formal physical therapy.  Not interested in local steroid injection at this time.  Recommended strengthening and range of motion exercises.  Try addition of Flexeril 5 mg at night as needed.  Upper back pain  Upper back pain across the trapezius muscles I think is more posture and overuse related and not from significant neck or shoulder arthritis.  Discussed some range of motion exercises for this might also benefit with the trial of adding Flexeril at night,  Orders: Orders Placed This Encounter  Procedures   Sedimentation rate   CBC with Differential/Platelet   COMPLETE METABOLIC PANEL WITH GFR   Meds ordered this encounter  Medications   cyclobenzaprine (FLEXERIL) 5 MG tablet    Sig: Take 1 tablet (5 mg total) by mouth at bedtime as needed for muscle spasms.    Dispense:  30 tablet    Refill:  0     Follow-Up Instructions: Return in about 3 months (around 01/18/2023) for RA on MTX f/u 3mos.   Fuller Plan, MD  Note - This record has been created using [employer].  Chart creation errors have been sought, but may not always  have been located. Such creation errors do not reflect on  the standard of medical care.

## 2022-10-18 ENCOUNTER — Ambulatory Visit: Payer: 59 | Attending: Internal Medicine | Admitting: Internal Medicine

## 2022-10-18 ENCOUNTER — Encounter: Payer: Self-pay | Admitting: Internal Medicine

## 2022-10-18 VITALS — BP 113/77 | HR 82 | Resp 17 | Ht 62.0 in | Wt 180.2 lb

## 2022-10-18 DIAGNOSIS — Z79899 Other long term (current) drug therapy: Secondary | ICD-10-CM

## 2022-10-18 DIAGNOSIS — M059 Rheumatoid arthritis with rheumatoid factor, unspecified: Secondary | ICD-10-CM | POA: Diagnosis not present

## 2022-10-18 DIAGNOSIS — M25551 Pain in right hip: Secondary | ICD-10-CM | POA: Diagnosis not present

## 2022-10-18 DIAGNOSIS — M549 Dorsalgia, unspecified: Secondary | ICD-10-CM | POA: Diagnosis not present

## 2022-10-18 LAB — CBC WITH DIFFERENTIAL/PLATELET
Absolute Monocytes: 544 cells/uL (ref 200–950)
Basophils Absolute: 58 cells/uL (ref 0–200)
Basophils Relative: 0.9 %
Eosinophils Absolute: 358 cells/uL (ref 15–500)
Eosinophils Relative: 5.6 %
HCT: 39.4 % (ref 35.0–45.0)
Hemoglobin: 13 g/dL (ref 11.7–15.5)
Lymphs Abs: 2995 cells/uL (ref 850–3900)
MCH: 29.5 pg (ref 27.0–33.0)
MCV: 89.3 fL (ref 80.0–100.0)
RDW: 13.2 % (ref 11.0–15.0)

## 2022-10-18 LAB — SEDIMENTATION RATE: Sed Rate: 34 mm/h — ABNORMAL HIGH (ref 0–30)

## 2022-10-18 MED ORDER — CYCLOBENZAPRINE HCL 5 MG PO TABS: 5.0000 mg | ORAL_TABLET | Freq: Every evening | ORAL | 0 refills | Status: DC | PRN

## 2022-10-19 LAB — COMPLETE METABOLIC PANEL WITH GFR
AG Ratio: 1.4 (calc) (ref 1.0–2.5)
ALT: 14 U/L (ref 6–29)
AST: 15 U/L (ref 10–35)
Albumin: 4.3 g/dL (ref 3.6–5.1)
Alkaline phosphatase (APISO): 89 U/L (ref 37–153)
BUN: 17 mg/dL (ref 7–25)
CO2: 26 mmol/L (ref 20–32)
Calcium: 9.8 mg/dL (ref 8.6–10.4)
Chloride: 108 mmol/L (ref 98–110)
Creat: 0.65 mg/dL (ref 0.50–1.03)
Globulin: 3 g/dL (calc) (ref 1.9–3.7)
Glucose, Bld: 103 mg/dL — ABNORMAL HIGH (ref 65–99)
Potassium: 4.4 mmol/L (ref 3.5–5.3)
Sodium: 141 mmol/L (ref 135–146)
Total Bilirubin: 0.3 mg/dL (ref 0.2–1.2)
Total Protein: 7.3 g/dL (ref 6.1–8.1)
eGFR: 107 mL/min/{1.73_m2} (ref >=60)

## 2022-10-19 LAB — CBC WITH DIFFERENTIAL/PLATELET
MCHC: 33 g/dL (ref 32.0–36.0)
MPV: 10.8 fL (ref 7.5–12.5)
Monocytes Relative: 8.5 %
Neutro Abs: 2445 cells/uL (ref 1500–7800)
Neutrophils Relative %: 38.2 %
Platelets: 327 10*3/uL (ref 140–400)
RBC: 4.41 10*6/uL (ref 3.80–5.10)
Total Lymphocyte: 46.8 %
WBC: 6.4 10*3/uL (ref 3.8–10.8)

## 2022-10-21 NOTE — Progress Notes (Signed)
Sedimentation rate is slightly increased at 34. Blood count and metabolic panel are fine. I do not think we need to change medications but can monitor for now if there is any trend or more flares.

## 2022-11-13 ENCOUNTER — Other Ambulatory Visit: Payer: Self-pay | Admitting: Internal Medicine

## 2022-11-13 DIAGNOSIS — M25551 Pain in right hip: Secondary | ICD-10-CM

## 2022-11-13 NOTE — Telephone Encounter (Signed)
Last Fill: 10/18/2022  Next Visit: 01/24/2023  Last Visit: 10/18/2022  Dx: Pain in right hip    Current Dose per office note on 10/18/2022: Flexeril 5 mg at night as needed.   Okay to refill Flexeril?

## 2022-11-18 ENCOUNTER — Emergency Department (HOSPITAL_COMMUNITY): Payer: 59

## 2022-11-18 ENCOUNTER — Other Ambulatory Visit: Payer: Self-pay

## 2022-11-18 ENCOUNTER — Emergency Department (HOSPITAL_COMMUNITY)
Admission: EM | Admit: 2022-11-18 | Discharge: 2022-11-18 | Disposition: A | Discharge status: Home / Self Care | Payer: 59 | Attending: Emergency Medicine | Admitting: Emergency Medicine

## 2022-11-18 ENCOUNTER — Encounter (HOSPITAL_COMMUNITY): Payer: Self-pay

## 2022-11-18 DIAGNOSIS — E119 Type 2 diabetes mellitus without complications: Secondary | ICD-10-CM | POA: Diagnosis not present

## 2022-11-18 DIAGNOSIS — R109 Unspecified abdominal pain: Secondary | ICD-10-CM | POA: Insufficient documentation

## 2022-11-18 DIAGNOSIS — N200 Calculus of kidney: Secondary | ICD-10-CM | POA: Diagnosis not present

## 2022-11-18 DIAGNOSIS — K838 Other specified diseases of biliary tract: Secondary | ICD-10-CM | POA: Diagnosis not present

## 2022-11-18 LAB — URINALYSIS, ROUTINE W REFLEX MICROSCOPIC
Bacteria, UA: NONE SEEN
Bilirubin Urine: NEGATIVE
Glucose, UA: NEGATIVE mg/dL
Ketones, ur: NEGATIVE mg/dL
Nitrite: NEGATIVE
Protein, ur: NEGATIVE mg/dL
Specific Gravity, Urine: 1.024 (ref 1.005–1.030)
pH: 5 (ref 5.0–8.0)

## 2022-11-18 LAB — BASIC METABOLIC PANEL WITH GFR
Anion gap: 14 (ref 5–15)
BUN: 13 mg/dL (ref 6–20)
CO2: 22 mmol/L (ref 22–32)
Calcium: 9.3 mg/dL (ref 8.9–10.3)
Chloride: 101 mmol/L (ref 98–111)
Creatinine, Ser: 0.68 mg/dL (ref 0.44–1.00)
GFR, Estimated: >60 mL/min
Glucose, Bld: 130 mg/dL — ABNORMAL HIGH (ref 70–99)
Potassium: 3.9 mmol/L (ref 3.5–5.1)
Sodium: 137 mmol/L (ref 135–145)

## 2022-11-18 LAB — CBC
HCT: 41 % (ref 36.0–46.0)
Hemoglobin: 13.5 g/dL (ref 12.0–15.0)
MCH: 30.4 pg (ref 26.0–34.0)
MCHC: 32.9 g/dL (ref 30.0–36.0)
MCV: 92.3 fL (ref 80.0–100.0)
Platelets: 288 K/uL (ref 150–400)
RBC: 4.44 MIL/uL (ref 3.87–5.11)
RDW: 14.5 % (ref 11.5–15.5)
WBC: 7.2 K/uL (ref 4.0–10.5)
nRBC: 0 % (ref 0.0–0.2)

## 2022-11-18 LAB — HEPATIC FUNCTION PANEL
ALT: 19 U/L (ref 0–44)
AST: 17 U/L (ref 15–41)
Albumin: 3.8 g/dL (ref 3.5–5.0)
Alkaline Phosphatase: 90 U/L (ref 38–126)
Bilirubin, Direct: <0.1 mg/dL (ref 0.0–0.2)
Total Bilirubin: 0.3 mg/dL (ref 0.3–1.2)
Total Protein: 7.2 g/dL (ref 6.5–8.1)

## 2022-11-18 LAB — LIPASE, BLOOD: Lipase: 37 U/L (ref 11–51)

## 2022-11-18 MED ORDER — KETOROLAC TROMETHAMINE 15 MG/ML IJ SOLN
15.0000 mg | Freq: Once | INTRAMUSCULAR | Status: AC
  Administered 2022-11-18: 15 mg via INTRAVENOUS
  Filled 2022-11-18: qty 1

## 2022-11-18 MED ORDER — METHOCARBAMOL 500 MG PO TABS: 500.0000 mg | ORAL_TABLET | Freq: Three times a day (TID) | ORAL | 0 refills | Status: DC | PRN

## 2022-11-18 NOTE — ED Provider Notes (Signed)
[place] EMERGENCY DEPARTMENT AT [facility] Provider Note   CSN: [id number] Arrival date & time: 11/18/22  0715     History  Chief Complaint  Patient presents with   Flank Pain    Rachel Meza is a 52 y.o. female.   Flank Pain  Patient presents with left flank pain.  Posterior.  Began yesterday.  Somewhat acutely.  No urinary symptoms.  Worse with certain movements.  Worse with walking.  No trauma.  No fever.  History of rheumatoid arthritis and does have some chronic back pain but states it is usually higher than this.  No abdominal pain.  Has been able to eat without difficulty.    Past Medical History:  Diagnosis Date   Blood transfusion without reported diagnosis    Diabetes mellitus without complication (HCC)    GERD (gastroesophageal reflux disease)    Hyperlipidemia     Home Medications Prior to Admission medications   Medication Sig Start Date End Date Taking? Authorizing Provider  methocarbamol (ROBAXIN) 500 MG tablet Take 1 tablet (500 mg total) by mouth every 8 (eight) hours as needed. 11/18/22  Yes Benjiman Core, MD  emtricitabine-tenofovir (TRUVADA) 200-300 MG tablet Take 1 tablet by mouth daily. Patient not taking: Reported on 10/18/2022 10/02/20   [provider]  folic acid (FOLVITE) 1 MG tablet Take 1 tablet (1 mg total) by mouth daily. 02/13/22   Rice, Jamesetta Orleans, MD  methotrexate (RHEUMATREX) 2.5 MG tablet TAKE 8 TABLETS (20 MG TOTAL) BY MOUTH ONCE A WEEK. 10/08/22   Pollyann Savoy, MD      Allergies    Patient has no known allergies.    Review of Systems   Review of Systems  Genitourinary:  Positive for flank pain.    Physical Exam Updated Vital Signs BP 130/80 (BP Location: Right Arm)   Pulse 73   Temp 98.1 F (36.7 C) (Oral)   Resp 14   Ht 5\' 2"  (1.575 m)   Wt 83 kg   SpO2 100%   BMI 33.47 kg/m  Physical Exam Vitals and nursing note reviewed.  Cardiovascular:     Rate and Rhythm: Regular rhythm.   Abdominal:     Tenderness: There is no abdominal tenderness.  Genitourinary:    Comments: CVA tenderness on left.  No rash. Skin:    Capillary Refill: Capillary refill takes less than 2 seconds.  Neurological:     Mental Status: She is alert and oriented to person, place, and time.     ED Results / Procedures / Treatments   Labs (all labs ordered are listed, but only abnormal results are displayed) Labs Reviewed  URINALYSIS, ROUTINE W REFLEX MICROSCOPIC - Abnormal; Notable for the following components:      Result Value   Hgb urine dipstick MODERATE (*)    Leukocytes,Ua TRACE (*)    All other components within normal limits  BASIC METABOLIC PANEL - Abnormal; Notable for the following components:   Glucose, Bld 130 (*)    All other components within normal limits  CBC  LIPASE, BLOOD  HEPATIC FUNCTION PANEL    EKG None  Radiology CT Renal Stone Study  Result Date: 11/18/2022 CLINICAL DATA:  Abdominal and flank pain EXAM: CT ABDOMEN AND PELVIS WITHOUT CONTRAST TECHNIQUE: Multidetector CT imaging of the abdomen and pelvis was performed following the standard protocol without IV contrast. RADIATION DOSE REDUCTION: This exam was performed according to the departmental dose-optimization program which includes automated exposure control, adjustment of the mA  and/or kV according to patient size and/or use of iterative reconstruction technique. COMPARISON:  10/20/2018 FINDINGS: Lower chest: Linear subsegmental atelectasis or scarring in the posterior basal segment left lower lobe. Hepatobiliary: Gallbladder not visualized and presumed surgically absent. CBD estimated at 7 mm in diameter, mildly dilated, although probably a physiologic response to cholecystectomy. Otherwise unremarkable. Pancreas: Unremarkable Spleen: Unremarkable Adrenals/Urinary Tract: 4 mm right kidney lower pole nonobstructive renal calculus. Otherwise unremarkable. Stomach/Bowel: Unremarkable.  Normal appendix.  Vascular/Lymphatic: Mild aorto iliac atheromatous vascular calcifications. Reproductive: Uterus absent.  Adnexa unremarkable. Other: No supplemental non-categorized findings. Musculoskeletal: Mild sclerosis along both sacroiliac joints suggesting bilateral chronic sacroiliitis. Right degenerative facet arthropathy at L4-5 and L5-S1 with associated reactive sclerosis. Mild degenerative hip arthropathy bilaterally. IMPRESSION: 1. 4 mm right kidney lower pole nonobstructive renal calculus. 2. Mild sclerosis along both sacroiliac joints suggesting bilateral chronic sacroiliitis. 3. Right degenerative facet arthropathy at L4-5 and L5-S1 with associated reactive sclerosis. 4. Mild degenerative hip arthropathy bilaterally. 5. Mild aortoiliac atheromatous vascular calcifications. 6. Gallbladder not visualized and presumed surgically absent. CBD estimated at 7 mm in diameter, mildly dilated, although probably a physiologic response to cholecystectomy. Aortic Atherosclerosis (ICD10-I70.0). Electronically Signed   By: Gaylyn Rong M.D.   On: 11/18/2022 08:40    Procedures Procedures    Medications Ordered in ED Medications  ketorolac (TORADOL) 15 MG/ML injection 15 mg (15 mg Intravenous Given 11/18/22 0841)    ED Course/ Medical Decision Making/ A&P                                 Medical Decision Making Amount and/or Complexity of Data Reviewed Labs: ordered. Radiology: ordered.  Risk Prescription drug management.   Patient with left-sided flank pain.  Differential diagnosis includes musculoskeletal pain, kidney stone, abdominal pathology.  Will get CT scan.  Will get lipase and hepatic function panel.  Will get urinalysis.  Will give Toradol for symptom relief.  Reviewed previous CT scan.  Patient also has history of rheumatoid arthritis.  Feeling better after treatment.  CT scan overall reassuring.  Biliary dilatation likely secondary to cholecystectomy.  Does have some hematuria that can  follow with PCP or urology.  Will discharge home with muscle relaxer.         Final Clinical Impression(s) / ED Diagnoses Final diagnoses:  Left flank pain    Rx / DC Orders ED Discharge Orders          Ordered    methocarbamol (ROBAXIN) 500 MG tablet  Every 8 hours PRN        11/18/22 1048              Benjiman Core, MD 11/18/22 [phone]

## 2022-11-18 NOTE — ED Triage Notes (Signed)
Pt came in via POV d/t Lt flank pain that began yesterday. Denies any urinary s/s, fevers, injuries/heavy lifting. Rates her pain 5/10, A/Ox4.

## 2022-11-18 NOTE — Discharge Instructions (Addendum)
The pain is most likely from the muscles.  Follow-up with your doctor as needed.  There was some blood in the urine that can be followed by urology or your primary care doctor.  Potentially some of the pain could be due to the rheumatoid arthritis and may need further treatment if it does not improve with the muscle relaxers.

## 2022-11-26 ENCOUNTER — Telehealth: Payer: Self-pay

## 2022-11-26 NOTE — Transitions of Care (Post Inpatient/ED Visit) (Signed)
   11/26/2022  Name: Rachel Meza MRN: [id number] DOB: [birth year]  Today's TOC FU Call Status: Today's TOC FU Call Status:: Unsuccessful Call (1st Attempt) Unsuccessful Call (1st Attempt) Date: 11/26/22  Attempted to reach the patient regarding the most recent Inpatient/ED visit.  Follow Up Plan: Additional outreach attempts will be made to reach the patient to complete the Transitions of Care (Post Inpatient/ED visit) call.     Antionette Fairy, RN,BSN,CCM [facility] Care Management Care Management Community Coordinator Direct Phone: [phone] Toll Free: [phone] Fax: [phone]

## 2022-11-27 ENCOUNTER — Telehealth: Payer: Self-pay

## 2022-11-27 NOTE — Transitions of Care (Post Inpatient/ED Visit) (Signed)
   11/27/2022  Name: [name] MRN: [id number] DOB: [birth year]  Today's TOC FU Call Status: Today's TOC FU Call Status:: Unsuccessful Call (2nd Attempt) Unsuccessful Call (2nd Attempt) Date: 11/27/22  Attempted to reach the patient regarding the most recent Inpatient/ED visit.  Follow Up Plan: Additional outreach attempts will be made to reach the patient to complete the Transitions of Care (Post Inpatient/ED visit) call.     Antionette Fairy, RN,BSN,CCM [facility] Care Management Care Management Community Coordinator Direct Phone: [phone] Toll Free: [phone] Fax: [phone]

## 2022-11-28 ENCOUNTER — Telehealth: Payer: Self-pay

## 2022-11-28 NOTE — Transitions of Care (Post Inpatient/ED Visit) (Signed)
   11/28/2022  Name: Rachel Meza MRN: [id number] DOB: 1971-03-20  Today's TOC FU Call Status: Today's TOC FU Call Status:: Unsuccessful Call (3rd Attempt) Unsuccessful Call (3rd Attempt) Date: 11/28/22   Red on EMMI-ED Discharge Alert Date & Reason:11/20/22 "Scheduled follow-up appt? No"  Attempted to reach the patient regarding the most recent Inpatient/ED visit.  Follow Up Plan: No further outreach attempts will be made at this time. We have been unable to contact the patient.    Antionette Fairy, RN,BSN,CCM [facility] Care Management Care Management Community Coordinator Direct Phone: [phone] Toll Free: [phone] Fax: [phone]

## 2022-12-06 ENCOUNTER — Ambulatory Visit (INDEPENDENT_AMBULATORY_CARE_PROVIDER_SITE_OTHER): Payer: 59 | Admitting: Family

## 2022-12-06 ENCOUNTER — Encounter: Payer: Self-pay | Admitting: Family

## 2022-12-06 VITALS — BP 123/82 | HR 97 | Temp 97.9°F | Ht 62.0 in | Wt 182.4 lb

## 2022-12-06 DIAGNOSIS — R319 Hematuria, unspecified: Secondary | ICD-10-CM

## 2022-12-06 DIAGNOSIS — Z9049 Acquired absence of other specified parts of digestive tract: Secondary | ICD-10-CM

## 2022-12-06 DIAGNOSIS — M1610 Unilateral primary osteoarthritis, unspecified hip: Secondary | ICD-10-CM | POA: Diagnosis not present

## 2022-12-06 DIAGNOSIS — M5137 Other intervertebral disc degeneration, lumbosacral region: Secondary | ICD-10-CM

## 2022-12-06 DIAGNOSIS — I700 Atherosclerosis of aorta: Secondary | ICD-10-CM

## 2022-12-06 DIAGNOSIS — R109 Unspecified abdominal pain: Secondary | ICD-10-CM

## 2022-12-06 DIAGNOSIS — M5136 Other intervertebral disc degeneration, lumbar region: Secondary | ICD-10-CM | POA: Diagnosis not present

## 2022-12-06 DIAGNOSIS — N200 Calculus of kidney: Secondary | ICD-10-CM

## 2022-12-06 DIAGNOSIS — Z131 Encounter for screening for diabetes mellitus: Secondary | ICD-10-CM

## 2022-12-06 NOTE — Progress Notes (Signed)
Pt wants to speak about her results from x-ray, and lab work.

## 2022-12-06 NOTE — Progress Notes (Signed)
Patient ID: Rachel Meza, female    DOB: 1970/07/29  MRN: [id number]  CC: Emergency Department Follow-Up  Subjective: Rachel Meza is a 52 y.o. female who presents for Emergency Department follow-up.  Her concerns today include:  11/18/2022 [facility] Emergency Department at [facility] per MD note: ED Course/ Medical Decision Making/ A&P                               Medical Decision Making Amount and/or Complexity of Data Reviewed Labs: ordered. Radiology: ordered.   Risk Prescription drug management.     Patient with left-sided flank pain.  Differential diagnosis includes musculoskeletal pain, kidney stone, abdominal pathology.  Will get CT scan.  Will get lipase and hepatic function panel.  Will get urinalysis.  Will give Toradol for symptom relief.  Reviewed previous CT scan.   Patient also has history of rheumatoid arthritis.   Feeling better after treatment.  CT scan overall reassuring.  Biliary dilatation likely secondary to cholecystectomy.  Does have some hematuria that can follow with PCP or urology.  Will discharge home with muscle relaxer.   Today's visit 12/06/2022: Reports feeling improved since Emergency Department follow-up. She denies red flag symptoms. Reports muscle relaxer helped. Would like to be screened for diabetes. No further issues/concerns for discussion today.  Patient Active Problem List   Diagnosis Date Noted   Upper back pain 10/18/2022   Pain in right hip 07/19/2022   High risk medication use 08/20/2021   Seropositive rheumatoid arthritis (HCC) 02/02/2021   Positive ANA (antinuclear antibody) 02/02/2021   Diabetes (HCC) 12/28/2020   Bacterial vaginitis 12/28/2020   Cholecystitis 10/20/2018     Current Outpatient Medications on File Prior to Visit  Medication Sig Dispense Refill   cyclobenzaprine (FLEXERIL) 5 MG tablet Take 5 mg by mouth 3 (three) times daily as needed for muscle spasms.     folic acid (FOLVITE) 1 MG tablet  Take 1 tablet (1 mg total) by mouth daily. 90 tablet 3   methocarbamol (ROBAXIN) 500 MG tablet Take 1 tablet (500 mg total) by mouth every 8 (eight) hours as needed. 8 tablet 0   methotrexate (RHEUMATREX) 2.5 MG tablet TAKE 8 TABLETS (20 MG TOTAL) BY MOUTH ONCE A WEEK. 32 tablet 2   emtricitabine-tenofovir (TRUVADA) 200-300 MG tablet Take 1 tablet by mouth daily. (Patient not taking: Reported on 10/18/2022)     No current facility-administered medications on file prior to visit.    No Known Allergies  Social History   Socioeconomic History   Marital status: Single    Spouse name: Not on file   Number of children: Not on file   Years of education: Not on file   Highest education level: Some college, no degree  Occupational History   Not on file  Tobacco Use   Smoking status: Every Day    Current packs/day: 0.50    Average packs/day: 0.5 packs/day for 10.0 years (5.0 ttl pk-yrs)    Types: Cigarettes    Passive exposure: Never   Smokeless tobacco: Never  Vaping Use   Vaping status: Never Used  Substance and Sexual Activity   Alcohol use: Yes    Alcohol/week: 3.0 standard drinks of alcohol    Types: 3 Standard drinks or equivalent per week    Comment: occ   Drug use: Never   Sexual activity: Not on file    Comment: Tubal ligation  Other Topics  Concern   Not on file  Social History Narrative   Not on file   Social Determinants of Health   Financial Resource Strain: Medium Risk (12/02/2022)   Overall Financial Resource Strain (CARDIA)    Difficulty of Paying Living Expenses: Somewhat hard  Food Insecurity: Food Insecurity Present (12/02/2022)   Hunger Vital Sign    Worried About Running Out of Food in the Last Year: Sometimes true    Ran Out of Food in the Last Year: Sometimes true  Transportation Needs: No Transportation Needs (12/02/2022)   PRAPARE - Administrator, Civil Service (Medical): No    Lack of Transportation (Non-Medical): No  Physical Activity:  Insufficiently Active (12/02/2022)   Exercise Vital Sign    Days of Exercise per Week: 1 day    Minutes of Exercise per Session: 30 min  Stress: Stress Concern Present (12/02/2022)   [employer] of Occupational Health - Occupational Stress Questionnaire    Feeling of Stress : To some extent  Social Connections: Unknown (12/02/2022)   Social Connection and Isolation Panel [NHANES]    Frequency of Communication with Friends and Family: Once a week    Frequency of Social Gatherings with Friends and Family: Patient declined    Attends Religious Services: Never    Database administrator or Organizations: No    Attends Engineer, structural: Not on file    Marital Status: Never married  Catering manager Violence: Not on file    Family History  Problem Relation Age of Onset   Colon cancer Neg Hx    Colon polyps Neg Hx    Esophageal cancer Neg Hx    Rectal cancer Neg Hx    Stomach cancer Neg Hx     Past Surgical History:  Procedure Laterality Date   ABDOMINAL HYSTERECTOMY     CHOLECYSTECTOMY N/A 10/20/2018   Procedure: LAPAROSCOPIC CHOLECYSTECTOMY;  Surgeon: Kinsinger, De Blanch, MD;  Location: [facility] OR;  Service: General;  Laterality: N/A;   COLONOSCOPY     UTERINE FIBROID SURGERY      ROS: Review of Systems Negative except as stated above  PHYSICAL EXAM: BP 123/82   Pulse 97   Temp 97.9 F (36.6 C) (Oral)   Ht 5\' 2"  (1.575 m)   Wt 182 lb 6.4 oz (82.7 kg)   SpO2 95%   BMI 33.36 kg/m   Physical Exam HENT:     Head: Normocephalic and atraumatic.     Nose: Nose normal.     Mouth/Throat:     Mouth: Mucous membranes are moist.     Pharynx: Oropharynx is clear.  Eyes:     Extraocular Movements: Extraocular movements intact.     Conjunctiva/sclera: Conjunctivae normal.     Pupils: Pupils are equal, round, and reactive to light.  Cardiovascular:     Rate and Rhythm: Normal rate and regular rhythm.     Pulses: Normal pulses.     Heart sounds: Normal heart  sounds.  Pulmonary:     Effort: Pulmonary effort is normal.     Breath sounds: Normal breath sounds.  Abdominal:     General: Bowel sounds are normal.     Palpations: Abdomen is soft.  Musculoskeletal:        General: Normal range of motion.     Cervical back: Normal range of motion and neck supple.  Neurological:     General: No focal deficit present.     Mental Status: She is alert and oriented  to person, place, and time.  Psychiatric:        Mood and Affect: Mood normal.        Behavior: Behavior normal.     ASSESSMENT AND PLAN: 1. Flank pain - Resolved.  2. Hematuria, unspecified type 3. Renal calculus - Referral to Urology for further evaluation/management. - Ambulatory referral to Urology  4. Aortic atherosclerosis (HCC) - Referral to Cardiology for further evaluation/management. - Ambulatory referral to Cardiology  5. Degeneration of L4-L5 intervertebral disc 6. Degenerative disc disease at L5-S1 level 7. Arthropathy of hip - Referral to Orthopedic Surgery for further evaluation/management. - Ambulatory referral to Orthopedic Surgery  8. History of cholecystectomy - Referral to Gastroenterology for further evaluation/management. - Ambulatory referral to Gastroenterology  9. Diabetes mellitus screening - Routine screening.  - POCT glycosylated hemoglobin (Hb A1C); Future   Patient was given the opportunity to ask questions.  Patient verbalized understanding of the plan and was able to repeat key elements of the plan. Patient was given clear instructions to go to Emergency Department or return to medical center if symptoms don't improve, worsen, or new problems develop.The patient verbalized understanding.   Orders Placed This Encounter  Procedures   Ambulatory referral to Urology   Ambulatory referral to Orthopedic Surgery   Ambulatory referral to Cardiology   Ambulatory referral to Gastroenterology   POCT glycosylated hemoglobin (Hb A1C)   Follow-up  with primary provider as scheduled.   Rema Fendt, NP

## 2022-12-20 ENCOUNTER — Encounter: Payer: Self-pay | Admitting: Urology

## 2022-12-20 ENCOUNTER — Ambulatory Visit (INDEPENDENT_AMBULATORY_CARE_PROVIDER_SITE_OTHER): Payer: 59 | Admitting: Urology

## 2022-12-20 VITALS — BP 113/76 | HR 97 | Ht 62.0 in | Wt 182.0 lb

## 2022-12-20 DIAGNOSIS — R3129 Other microscopic hematuria: Secondary | ICD-10-CM | POA: Diagnosis not present

## 2022-12-20 DIAGNOSIS — N200 Calculus of kidney: Secondary | ICD-10-CM | POA: Diagnosis not present

## 2022-12-20 DIAGNOSIS — R3121 Asymptomatic microscopic hematuria: Secondary | ICD-10-CM | POA: Diagnosis not present

## 2022-12-20 LAB — MICROSCOPIC EXAMINATION

## 2022-12-20 LAB — URINALYSIS, ROUTINE W REFLEX MICROSCOPIC
Bilirubin, UA: NEGATIVE
Glucose, UA: NEGATIVE
Ketones, UA: NEGATIVE
Leukocytes,UA: NEGATIVE
Nitrite, UA: NEGATIVE
Protein,UA: NEGATIVE
Specific Gravity, UA: 1.025 (ref 1.005–1.030)
Urobilinogen, Ur: 0.2 mg/dL (ref 0.2–1.0)
pH, UA: 5.5 (ref 5.0–7.5)

## 2022-12-20 NOTE — Progress Notes (Signed)
Assessment: 1. Microscopic hematuria   2. Nephrolithiasis     Plan: I personally reviewed the patient's chart including provider notes, labs and imaging results. I personally reviewed the CT study from 11/18/2022 with results as noted below. The right sided kidney stone has been present for at least 4 years and is stable in size and in location.  I do not recommend any treatment for the stone at this time. Today I had a discussion with the patient regarding the findings of microscopic hematuria including the implications and differential diagnoses associated with it.  I also discussed recommendations for further evaluation including the rationale for upper tract imaging and cystoscopy.  I discussed the nature of these procedures including potential risk and complications.  The patient expressed an understanding of these issues. CxBladder detect sent today Schedule for cystoscopy   Chief Complaint:  Chief Complaint  Patient presents with   Nephrolithiasis    History of Present Illness:  Rachel Meza is a 52 y.o. female who is seen in consultation from Rema Fendt, NP for evaluation of nephrolithiasis. She was seen in the emergency room on 11/18/2022 with left flank/back pain.  Urinalysis demonstrated 0-5 RBCs, 0-5 WBCs. CT imaging from 11/18/22 showed a 4 mm calculus in the right lower pole without evidence of obstruction.  No other calculi seen. Prior CT imaging from 7/20 showed a 4-5 mm calculus in the right lower pole without obstruction. Her left-sided pain has improved. She has baseline symptoms of frequency, urgency, nocturia x 2, and occasional urge incontinence.  No dysuria or gross hematuria. She does have history of tobacco use smoking 2 packs/week for approximately 20 years.   Past Medical History:  Past Medical History:  Diagnosis Date   Blood transfusion without reported diagnosis    Diabetes mellitus without complication (HCC)    GERD (gastroesophageal reflux  disease)    Hyperlipidemia     Past Surgical History:  Past Surgical History:  Procedure Laterality Date   ABDOMINAL HYSTERECTOMY     CHOLECYSTECTOMY N/A 10/20/2018   Procedure: LAPAROSCOPIC CHOLECYSTECTOMY;  Surgeon: Rodman Pickle, MD;  Location: [facility] OR;  Service: General;  Laterality: N/A;   COLONOSCOPY     UTERINE FIBROID SURGERY      Allergies:  No Known Allergies  Family History:  Family History  Problem Relation Age of Onset   Colon cancer Neg Hx    Colon polyps Neg Hx    Esophageal cancer Neg Hx    Rectal cancer Neg Hx    Stomach cancer Neg Hx     Social History:  Social History   Tobacco Use   Smoking status: Every Day    Current packs/day: 0.50    Average packs/day: 0.5 packs/day for 10.0 years (5.0 ttl pk-yrs)    Types: Cigarettes    Passive exposure: Never   Smokeless tobacco: Never  Vaping Use   Vaping status: Never Used  Substance Use Topics   Alcohol use: Yes    Alcohol/week: 3.0 standard drinks of alcohol    Types: 3 Standard drinks or equivalent per week    Comment: occ   Drug use: Never    Review of symptoms:  Constitutional:  Negative for unexplained weight loss, night sweats, fever, chills ENT:  Negative for nose bleeds, sinus pain, painful swallowing CV:  Negative for chest pain, shortness of breath, exercise intolerance, palpitations, loss of consciousness Resp:  Negative for cough, wheezing, shortness of breath GI:  Negative for nausea, vomiting, diarrhea, bloody stools  GU:  Positives noted in HPI; otherwise negative for gross hematuria, dysuria Neuro:  Negative for seizures, poor balance, limb weakness, slurred speech Psych:  Negative for lack of energy, depression, anxiety Endocrine:  Negative for polydipsia, polyuria, symptoms of hypoglycemia (dizziness, hunger, sweating) Hematologic:  Negative for anemia, purpura, petechia, prolonged or excessive bleeding, use of anticoagulants  Allergic:  Negative for difficulty breathing or  choking as a result of exposure to anything; no shellfish allergy; no allergic response (rash/itch) to materials, foods  Physical exam: BP 113/76   Pulse 97   Ht 5\' 2"  (1.575 m)   Wt 182 lb (82.6 kg)   BMI 33.29 kg/m  GENERAL APPEARANCE:  Well appearing, well developed, well nourished, NAD HEENT: Atraumatic, Normocephalic, oropharynx clear. NECK: Supple without lymphadenopathy or thyromegaly. LUNGS: Clear to auscultation bilaterally. HEART: Regular Rate and Rhythm without murmurs, gallops, or rubs. ABDOMEN: Soft, non-tender, No Masses. EXTREMITIES: Moves all extremities well.  Without clubbing, cyanosis, or edema. NEUROLOGIC:  Alert and oriented x 3, normal gait, CN II-XII grossly intact.  MENTAL STATUS:  Appropriate. BACK:  Non-tender to palpation.  No CVAT SKIN:  Warm, dry and intact.    Results: U/A: 3-10 RBC

## 2022-12-21 ENCOUNTER — Other Ambulatory Visit: Payer: Self-pay | Admitting: Internal Medicine

## 2022-12-21 DIAGNOSIS — M25551 Pain in right hip: Secondary | ICD-10-CM

## 2022-12-30 ENCOUNTER — Encounter: Payer: Self-pay | Admitting: Urology

## 2023-01-03 ENCOUNTER — Other Ambulatory Visit: Payer: Self-pay | Admitting: Rheumatology

## 2023-01-03 DIAGNOSIS — M059 Rheumatoid arthritis with rheumatoid factor, unspecified: Secondary | ICD-10-CM

## 2023-01-03 NOTE — Telephone Encounter (Signed)
Last Fill: 10/08/2022  Labs: 11/18/2022 Glucose 130  Next Visit: 01/24/2023  Last Visit: 10/18/2022  DX: Seropositive rheumatoid arthritis   Current Dose per office note 10/18/2022: methotrexate 20 mg p.o. weekly   Okay to refill Methotrexate?

## 2023-01-11 ENCOUNTER — Other Ambulatory Visit: Payer: Self-pay | Admitting: Internal Medicine

## 2023-01-13 NOTE — Telephone Encounter (Signed)
Last Fill: 11/13/2022   Next Visit: 01/24/2023   Last Visit: 10/18/2022   Dx: Pain in right hip     Current Dose per office note on 10/18/2022: Flexeril 5 mg at night as needed.    Okay to refill Flexeril?

## 2023-01-14 NOTE — Progress Notes (Signed)
Office Visit Note  Patient: Rachel Meza             Date of Birth: 05-Aug-1970           MRN: [id number]             PCP: Rema Fendt, NP Referring: Rema Fendt, NP Visit Date: 01/24/2023   Subjective:  Follow-up (Patient states her hands are starting to hurt more. )   History of Present Illness: Rachel Meza is a 52 y.o. female here for follow up for seropositive RA on methotrexate 20 mg p.o. weekly folic acid 1 mg daily.  She continues to have some worsening bilateral hand pain and stiffness especially bothersome at night and keeps her from sleeping sometimes.  She is not having any particular numbness or loss of grip strength although it is painful to close her fist tightly.  She notices some persistent swelling throughout the hands although reports it is still much less than her initial symptoms  Previous HPI 10/18/2022 Rachel Meza is a 52 y.o. female here for follow up for seropositive RA on methotrexate 20 mg p.o. weekly folic acid 1 mg daily.  Currently joint pain and stiffness has been pretty stable does not experience any major flareups and is not seeing a lot of visible swelling recently.  Still has some pain and stiffness in her hands with occasional numbness but not having any trouble with grip or dropping items.  Has some chronic pain across the upper back between her neck and shoulders.  Also with ongoing right hip pain sometimes radiating most often bothers her if she lies on the right side.   Previous HPI 02/19/2021 Rachel Meza is a 52 y.o. female here for follow up with multiple joint and body pains with positive ANA, RF, and elevated ESR. Labs at initial visit showed low positive RNP 1.8 other ENA tests negative.  There have been no significant change in symptoms since our last visit.  She reports some episodic low back pain but not ongoing at this time.  No large increase in joint swelling erythema or range of motion change.   Previous  HPI 02/02/21 Rachel Meza is a 52 y.o. female here for evaluation of generalized body pains with abnormal labs including positive ANA, positive RF, and elevated sedimentation rate. She started having pain and numbness symptoms in her bilateral hands since around 2008 with no specific onset or associated events that she can recall. This occasionally worsened with pain radiating up the arm and wrist as well. She has noticed increase in pain in her shoulders and knees during the past few years also gradual onset and progression. She experiences some swelling in her feet and toes but not elsewhere at involved areas. The hands often also feel numb sometimes throughout sometimes limited to specific fingers. She occasionally loses her grip spontaneously but has normal grip strength much of the time.  She has not noticed any significant discoloration. She does not take any medications for these problems she is not sure what would be useful. She has no past major joint injury or surgery.   Labs reviewed 12/2020 ANA pos RF 70.9 ESR 56 CBC wnl CMP wnl Hgb A1c 6.8% HCV neg BV pos   Review of Systems  Constitutional:  Positive for fatigue.  HENT:  Positive for mouth dryness. Negative for mouth sores.   Eyes:  Negative for dryness.  Respiratory:  Positive for shortness of breath.   Cardiovascular:  Positive for palpitations. Negative  for chest pain.  Gastrointestinal:  Positive for constipation. Negative for blood in stool and diarrhea.  Endocrine: Positive for increased urination.  Genitourinary:  Negative for involuntary urination.  Musculoskeletal:  Positive for joint pain, gait problem, joint pain, joint swelling, myalgias, muscle weakness, morning stiffness, muscle tenderness and myalgias.  Skin:  Negative for color change, rash, hair loss and sensitivity to sunlight.  Allergic/Immunologic: Negative for susceptible to infections.  Neurological:  Positive for dizziness and headaches.   Hematological:  Negative for swollen glands.  Psychiatric/Behavioral:  Positive for sleep disturbance. Negative for depressed mood. The patient is not nervous/anxious.     PMFS History:  Patient Active Problem List   Diagnosis Date Noted   Nephrolithiasis 12/20/2022   Microscopic hematuria 12/20/2022   Upper back pain 10/18/2022   Pain in right hip 07/19/2022   High risk medication use 08/20/2021   Seropositive rheumatoid arthritis (HCC) 02/02/2021   Positive ANA (antinuclear antibody) 02/02/2021   Diabetes (HCC) 12/28/2020   Bacterial vaginitis 12/28/2020   Cholecystitis 10/20/2018    Past Medical History:  Diagnosis Date   Blood transfusion without reported diagnosis    Diabetes mellitus without complication (HCC)    GERD (gastroesophageal reflux disease)    Hyperlipidemia     Family History  Problem Relation Age of Onset   Colon cancer Neg Hx    Colon polyps Neg Hx    Esophageal cancer Neg Hx    Rectal cancer Neg Hx    Stomach cancer Neg Hx    Past Surgical History:  Procedure Laterality Date   ABDOMINAL HYSTERECTOMY     CHOLECYSTECTOMY N/A 10/20/2018   Procedure: LAPAROSCOPIC CHOLECYSTECTOMY;  Surgeon: Rodman Pickle, MD;  Location: [facility] OR;  Service: General;  Laterality: N/A;   COLONOSCOPY     UTERINE FIBROID SURGERY     Social History   Social History Narrative   Not on file   Immunization History  Administered Date(s) Administered   Influenza-Unspecified 11/03/2020   Pneumococcal Polysaccharide-23 10/21/2018     Objective: Vital Signs: BP 117/79 (BP Location: Right Arm, Patient Position: Sitting, Cuff Size: Normal)   Pulse (!) 101   Resp 14   Ht 5\' 2"  (1.575 m)   Wt 189 lb (85.7 kg)   BMI 34.57 kg/m    Physical Exam Constitutional:      Appearance: She is obese.  Eyes:     Conjunctiva/sclera: Conjunctivae normal.  Cardiovascular:     Rate and Rhythm: Normal rate and regular rhythm.  Pulmonary:     Effort: Pulmonary effort is normal.      Breath sounds: Normal breath sounds.  Musculoskeletal:     Right lower leg: No edema.     Left lower leg: No edema.  Lymphadenopathy:     Cervical: No cervical adenopathy.  Skin:    General: Skin is warm and dry.     Findings: No rash.  Neurological:     Mental Status: She is alert.  Psychiatric:        Mood and Affect: Mood normal.     Musculoskeletal Exam:  Shoulders full ROM no tenderness or swelling Elbows full ROM no tenderness or swelling Wrists full ROM no tenderness or swelling Fingers full ROM, no focal tenderness to pressure there is slight soft tissues swelling at MCPs and distally, no discoloration or nail changes Right hip mildly decreased internal rotation and lateral tenderness to pressure Knees full ROM no tenderness or swelling  Investigation: No additional findings.  Imaging: No results  found.  Recent Labs: Lab Results  Component Value Date   WBC 7.2 11/18/2022   HGB 13.5 11/18/2022   PLT 288 11/18/2022   NA 137 11/18/2022   K 3.9 11/18/2022   CL 101 11/18/2022   CO2 22 11/18/2022   GLUCOSE 130 (H) 11/18/2022   BUN 13 11/18/2022   CREATININE 0.68 11/18/2022   BILITOT 0.3 11/18/2022   ALKPHOS 90 11/18/2022   AST 17 11/18/2022   ALT 19 11/18/2022   PROT 7.2 11/18/2022   ALBUMIN 3.8 11/18/2022   CALCIUM 9.3 11/18/2022   GFRAA >60 10/19/2018   QFTBGOLDPLUS NEGATIVE 08/20/2021    Speciality Comments: No specialty comments available.  Procedures:  No procedures performed Allergies: Patient has no known allergies.   Assessment / Plan:     Visit Diagnoses: Seropositive rheumatoid arthritis (HCC) - Plan: Sedimentation rate, methotrexate (RHEUMATREX) 2.5 MG tablet  Symptoms and recent labs appear consistent for active rheumatoid arthritis there is no focal joint synovitis but has soft tissue swelling in both hands.  Rechecking sedimentation rate for disease activity monitoring.  Plan to continue methotrexate 20 mg p.o. weekly folic acid 1 mg  daily.  If labs remain consistent with disease activity we will also recommend adding hydroxychloroquine 200 mg once daily.  High risk medication use - methotrexate 20 mg p.o. weekly folic acid 1 mg daily. - Plan: CBC with Differential/Platelet, COMPLETE METABOLIC PANEL WITH GFR  Checking CBC and CMP for medication monitoring on continued long-term use of methotrexate.  No serious interval infections.  Discussed risks of hydroxychloroquine including sensitivity reactions, QT prolongation, risk of retinal toxicity with long-term use and need for annual monitoring.  Pain in right hip - Flexeril 5 mg at night as needed.  Right hip with lateral pain consistent with gluteal tendinopathy or other greater trochanteric pain syndrome.  Continue as needed Flexeril 5 mg.  Left flank pain  Had interval ED visit due to flank pain with reassuring exam and CT scan treated with Toradol and muscle relaxant medication.  Symptoms improved have not been recurrent.  She was seen in urology clinic for microscopic hematuria and nephrolithiasis  Orders: Orders Placed This Encounter  Procedures   Sedimentation rate   CBC with Differential/Platelet   COMPLETE METABOLIC PANEL WITH GFR   Meds ordered this encounter  Medications   methotrexate (RHEUMATREX) 2.5 MG tablet    Sig: Take 8 tablets (20 mg total) by mouth once a week. Caution:Chemotherapy. Protect from light.    Dispense:  104 tablet    Refill:  0   folic acid (FOLVITE) 1 MG tablet    Sig: Take 1 tablet (1 mg total) by mouth daily.    Dispense:  90 tablet    Refill:  3   cyclobenzaprine (FLEXERIL) 5 MG tablet    Sig: Take 1 tablet (5 mg total) by mouth at bedtime as needed for muscle spasms.    Dispense:  30 tablet    Refill:  0     Follow-Up Instructions: No follow-ups on file.   Fuller Plan, MD  Note - This record has been created using [employer].  Chart creation errors have been sought, but may not always  have been located.  Such creation errors do not reflect on  the standard of medical care.

## 2023-01-24 ENCOUNTER — Encounter: Payer: Self-pay | Admitting: Internal Medicine

## 2023-01-24 ENCOUNTER — Ambulatory Visit: Payer: 59 | Attending: Internal Medicine | Admitting: Internal Medicine

## 2023-01-24 VITALS — BP 117/79 | HR 101 | Resp 14 | Ht 62.0 in | Wt 189.0 lb

## 2023-01-24 DIAGNOSIS — M059 Rheumatoid arthritis with rheumatoid factor, unspecified: Secondary | ICD-10-CM | POA: Diagnosis not present

## 2023-01-24 DIAGNOSIS — M79641 Pain in right hand: Secondary | ICD-10-CM | POA: Diagnosis not present

## 2023-01-24 DIAGNOSIS — M79642 Pain in left hand: Secondary | ICD-10-CM | POA: Diagnosis not present

## 2023-01-24 DIAGNOSIS — M25551 Pain in right hip: Secondary | ICD-10-CM | POA: Diagnosis not present

## 2023-01-24 DIAGNOSIS — Z79899 Other long term (current) drug therapy: Secondary | ICD-10-CM | POA: Diagnosis not present

## 2023-01-24 DIAGNOSIS — M549 Dorsalgia, unspecified: Secondary | ICD-10-CM | POA: Diagnosis not present

## 2023-01-24 MED ORDER — METHOTREXATE SODIUM 2.5 MG PO TABS: 20.0000 mg | ORAL_TABLET | ORAL | 0 refills | Status: AC

## 2023-01-24 MED ORDER — FOLIC ACID 1 MG PO TABS
1.0000 mg | ORAL_TABLET | Freq: Every day | ORAL | 3 refills | Status: AC
Start: 2023-01-24 — End: ?

## 2023-01-24 MED ORDER — CYCLOBENZAPRINE HCL 5 MG PO TABS: 5.0000 mg | ORAL_TABLET | Freq: Every evening | ORAL | 0 refills | Status: DC | PRN

## 2023-01-24 NOTE — Patient Instructions (Signed)
 Hydroxychloroquine Tablets What is this medication? HYDROXYCHLOROQUINE (hye drox ee KLOR oh kwin) treats autoimmune conditions, such as rheumatoid arthritis and lupus. It works by slowing down an overactive immune system. It may also be used to prevent and treat malaria. It works by killing the parasite that causes malaria. It belongs to a group of medications called DMARDs. This medicine may be used for other purposes; ask your health care provider or pharmacist if you have questions. COMMON BRAND NAME(S): Plaquenil, Quineprox, SOVUNA What should I tell my care team before I take this medication? They need to know if you have any of these conditions: Diabetes Eye disease, vision problems Frequently drink alcohol G6PD deficiency Heart disease Irregular heartbeat or rhythm Kidney disease Liver disease Porphyria Psoriasis An unusual or allergic reaction to hydroxychloroquine, other medications, foods, dyes, or preservatives Pregnant or trying to get pregnant Breastfeeding How should I use this medication? Take this medication by mouth with water. Take it as directed on the prescription label. Do not cut, crush, or chew this medication. Swallow the tablets whole. Take it with food. Do not take it more than directed. Take all of this medication unless your care team tells you to stop it early. Keep taking it even if you think you are better. Take products with antacids in them at a different time of day than this medication. Take this medication 4 hours before or 4 hours after antacids. Talk to your care team if you have questions. Talk to your care team about the use of this medication in children. While this medication may be prescribed for selected conditions, precautions do apply. Overdosage: If you think you have taken too much of this medicine contact a poison control center or emergency room at once. NOTE: This medicine is only for you. Do not share this medicine with others. What if I  miss a dose? If you miss a dose, take it as soon as you can. If it is almost time for your next dose, take only that dose. Do not take double or extra doses. What may interact with this medication? Do not take this medication with any of the following: Cisapride Dronedarone Pimozide Thioridazine This medication may also interact with the following: Ampicillin Antacids Cimetidine Cyclosporine Digoxin Kaolin Medications for diabetes, such as insulin, glipizide, glyburide Medications for seizures, such as carbamazepine, phenobarbital, phenytoin Mefloquine Methotrexate Other medications that cause heart rhythm changes Praziquantel This list may not describe all possible interactions. Give your health care provider a list of all the medicines, herbs, non-prescription drugs, or dietary supplements you use. Also tell them if you smoke, drink alcohol, or use illegal drugs. Some items may interact with your medicine. What should I watch for while using this medication? Visit your care team for regular checks on your progress. Tell your care team if your symptoms do not start to get better or if they get worse. You may need blood work done while you are taking this medication. If you take other medications that can affect heart rhythm, you may need more testing. Talk to your care team if you have questions. Your vision may be tested before and during use of this medication. Tell your care team right away if you have any change in your eyesight. This medication may cause serious skin reactions. They can happen weeks to months after starting the medication. Contact your care team right away if you notice fevers or flu-like symptoms with a rash. The rash may be red or purple and then  turn into blisters or peeling of the skin. Or, you might notice a red rash with swelling of the face, lips or lymph nodes in your neck or under your arms. If you or your family notice any changes in your behavior, such as  new or worsening depression, thoughts of harming yourself, anxiety, or other unusual or disturbing thoughts, or memory loss, call your care team right away. What side effects may I notice from receiving this medication? Side effects that you should report to your care team as soon as possible: Allergic reactions--skin rash, itching, hives, swelling of the face, lips, tongue, or throat Aplastic anemia--unusual weakness or fatigue, dizziness, headache, trouble breathing, increased bleeding or bruising Change in vision Heart rhythm changes--fast or irregular heartbeat, dizziness, feeling faint or lightheaded, chest pain, trouble breathing Infection--fever, chills, cough, or sore throat Low blood sugar (hypoglycemia)--tremors or shaking, anxiety, sweating, cold or clammy skin, confusion, dizziness, rapid heartbeat Muscle injury--unusual weakness or fatigue, muscle pain, dark yellow or brown urine, decrease in amount of urine Pain, tingling, or numbness in the hands or feet Rash, fever, and swollen lymph nodes Redness, blistering, peeling, or loosening of the skin, including inside the mouth Thoughts of suicide or self-harm, worsening mood, or feelings of depression Unusual bruising or bleeding Side effects that usually do not require medical attention (report to your care team if they continue or are bothersome): Diarrhea Headache Nausea Stomach pain Vomiting This list may not describe all possible side effects. Call your doctor for medical advice about side effects. You may report side effects to FDA at 1-800-FDA-1088. Where should I keep my medication? Keep out of the reach of children and pets. Store at room temperature up to 30 degrees C (86 degrees F). Protect from light. Get rid of any unused medication after the expiration date. To get rid of medications that are no longer needed or have expired: Take the medication to a medication take-back program. Check with your pharmacy or law  enforcement to find a location. If you cannot return the medication, check the label or package insert to see if the medication should be thrown out in the garbage or flushed down the toilet. If you are not sure, ask your care team. If it is safe to put it in the trash, empty the medication out of the container. Mix the medication with cat litter, dirt, coffee grounds, or other unwanted substance. Seal the mixture in a bag or container. Put it in the trash. NOTE: This sheet is a summary. It may not cover all possible information. If you have questions about this medicine, talk to your doctor, pharmacist, or health care provider.  2024 Elsevier/Gold Standard (2021-10-01 00:00:00)

## 2023-01-25 LAB — COMPLETE METABOLIC PANEL WITH GFR
AG Ratio: 1.5 (calc) (ref 1.0–2.5)
ALT: 20 U/L (ref 6–29)
AST: 15 U/L (ref 10–35)
Albumin: 4.4 g/dL (ref 3.6–5.1)
Alkaline phosphatase (APISO): 99 U/L (ref 37–153)
BUN: 14 mg/dL (ref 7–25)
CO2: 25 mmol/L (ref 20–32)
Calcium: 9.7 mg/dL (ref 8.6–10.4)
Chloride: 106 mmol/L (ref 98–110)
Creat: 0.73 mg/dL (ref 0.50–1.03)
Globulin: 3 g/dL (ref 1.9–3.7)
Glucose, Bld: 112 mg/dL — ABNORMAL HIGH (ref 65–99)
Potassium: 4.3 mmol/L (ref 3.5–5.3)
Sodium: 139 mmol/L (ref 135–146)
Total Bilirubin: 0.2 mg/dL (ref 0.2–1.2)
Total Protein: 7.4 g/dL (ref 6.1–8.1)
eGFR: 100 mL/min/{1.73_m2} (ref >=60)

## 2023-01-25 LAB — CBC WITH DIFFERENTIAL/PLATELET
Absolute Lymphocytes: 2599 {cells}/uL (ref 850–3900)
Absolute Monocytes: 553 {cells}/uL (ref 200–950)
Basophils Absolute: 63 {cells}/uL (ref 0–200)
Basophils Relative: 1.1 %
Eosinophils Absolute: 422 {cells}/uL (ref 15–500)
Eosinophils Relative: 7.4 %
HCT: 41.1 % (ref 35.0–45.0)
Hemoglobin: 13.5 g/dL (ref 11.7–15.5)
MCH: 29 pg (ref 27.0–33.0)
MCHC: 32.8 g/dL (ref 32.0–36.0)
MCV: 88.2 fL (ref 80.0–100.0)
MPV: 10.5 fL (ref 7.5–12.5)
Monocytes Relative: 9.7 %
Neutro Abs: 2063 {cells}/uL (ref 1500–7800)
Neutrophils Relative %: 36.2 %
Platelets: 363 10*3/uL (ref 140–400)
RBC: 4.66 10*6/uL (ref 3.80–5.10)
RDW: 13.4 % (ref 11.0–15.0)
Total Lymphocyte: 45.6 %
WBC: 5.7 10*3/uL (ref 3.8–10.8)

## 2023-01-25 LAB — SEDIMENTATION RATE: Sed Rate: 34 mm/h — ABNORMAL HIGH (ref 0–30)

## 2023-01-31 ENCOUNTER — Encounter: Payer: Self-pay | Admitting: Urology

## 2023-01-31 ENCOUNTER — Ambulatory Visit (INDEPENDENT_AMBULATORY_CARE_PROVIDER_SITE_OTHER): Payer: 59 | Admitting: Urology

## 2023-01-31 VITALS — BP 121/82 | HR 94 | Ht 62.0 in | Wt 187.0 lb

## 2023-01-31 DIAGNOSIS — N200 Calculus of kidney: Secondary | ICD-10-CM | POA: Diagnosis not present

## 2023-01-31 DIAGNOSIS — R3129 Other microscopic hematuria: Secondary | ICD-10-CM | POA: Diagnosis not present

## 2023-01-31 LAB — URINALYSIS, ROUTINE W REFLEX MICROSCOPIC
Bilirubin, UA: NEGATIVE
Glucose, UA: NEGATIVE
Ketones, UA: NEGATIVE
Leukocytes,UA: NEGATIVE
Nitrite, UA: NEGATIVE
Protein,UA: NEGATIVE
Specific Gravity, UA: 1.025 (ref 1.005–1.030)
Urobilinogen, Ur: 0.2 mg/dL (ref 0.2–1.0)
pH, UA: 5.5 (ref 5.0–7.5)

## 2023-01-31 LAB — MICROSCOPIC EXAMINATION

## 2023-01-31 MED ORDER — CIPROFLOXACIN HCL 500 MG PO TABS
500.0000 mg | ORAL_TABLET | Freq: Once | ORAL | Status: AC
  Administered 2023-01-31: 500 mg via ORAL

## 2023-01-31 NOTE — Addendum Note (Signed)
Addended by: Lizbeth Bark on: 01/31/2023 10:29 AM   Modules accepted: Orders

## 2023-01-31 NOTE — Progress Notes (Signed)
Assessment: 1. Microscopic hematuria   2. Nephrolithiasis     Plan: Cipro 500 mg x 1 following cystoscopy Results of cystoscopy discussed with the patient today. Given the normal CX bladder test, no additional urine testing or other imaging is indicated at this time. No serious or life-threatening causes for the microscopic hematuria have been identified. The right sided kidney stone has been present for at least 4 years and is stable in size and in location.  I do not recommend any treatment for the stone at this time. Stone prevention discussed. Recommend follow-up in 6 months.  Chief Complaint:  Chief Complaint  Patient presents with   Cysto    History of Present Illness:  Rachel Meza is a 52 y.o. female who is seen for further evaluation of microscopic hematuria and nephrolithiasis. She was seen in the emergency room on 11/18/2022 with left flank/back pain.  Urinalysis demonstrated 0-5 RBCs, 0-5 WBCs. CT imaging from 11/18/22 showed a 4 mm calculus in the right lower pole without evidence of obstruction.  No other calculi seen. Prior CT imaging from 7/20 showed a 4-5 mm calculus in the right lower pole without obstruction. Her left-sided pain  improved. She has baseline symptoms of frequency, urgency, nocturia x 2, and occasional urge incontinence.  No dysuria or gross hematuria. She does have history of tobacco use smoking 2 packs/week for approximately 20 years. CxBladder was 0.09 (normal).  She presents today for cystoscopy. No dysuria or gross hematuria.  Portions of the above documentation were copied from a prior visit for review purposes only.   Past Medical History:  Past Medical History:  Diagnosis Date   Blood transfusion without reported diagnosis    Diabetes mellitus without complication (HCC)    GERD (gastroesophageal reflux disease)    Hyperlipidemia     Past Surgical History:  Past Surgical History:  Procedure Laterality Date   ABDOMINAL  HYSTERECTOMY     CHOLECYSTECTOMY N/A 10/20/2018   Procedure: LAPAROSCOPIC CHOLECYSTECTOMY;  Surgeon: Rodman Pickle, MD;  Location: [facility] OR;  Service: General;  Laterality: N/A;   COLONOSCOPY     UTERINE FIBROID SURGERY      Allergies:  No Known Allergies  Family History:  Family History  Problem Relation Age of Onset   Colon cancer Neg Hx    Colon polyps Neg Hx    Esophageal cancer Neg Hx    Rectal cancer Neg Hx    Stomach cancer Neg Hx     Social History:  Social History   Tobacco Use   Smoking status: Every Day    Current packs/day: 0.50    Average packs/day: 0.5 packs/day for 10.0 years (5.0 ttl pk-yrs)    Types: Cigarettes    Passive exposure: Never   Smokeless tobacco: Never  Vaping Use   Vaping status: Never Used  Substance Use Topics   Alcohol use: Yes    Alcohol/week: 3.0 standard drinks of alcohol    Types: 3 Standard drinks or equivalent per week    Comment: occ   Drug use: Never    ROS: Constitutional:  Negative for fever, chills, weight loss CV: Negative for chest pain, previous MI, hypertension Respiratory:  Negative for shortness of breath, wheezing, sleep apnea, frequent cough GI:  Negative for nausea, vomiting, bloody stool, GERD  Physical exam: BP 121/82   Pulse 94   Ht 5\' 2"  (1.575 m)   Wt 187 lb (84.8 kg)   BMI 34.20 kg/m  GENERAL APPEARANCE:  Well appearing, well  developed, well nourished, NAD HEENT:  Atraumatic, normocephalic, oropharynx clear NECK:  Supple without lymphadenopathy or thyromegaly ABDOMEN:  Soft, non-tender, no masses EXTREMITIES:  Moves all extremities well, without clubbing, cyanosis, or edema NEUROLOGIC:  Alert and oriented x 3, normal gait, CN II-XII grossly intact MENTAL STATUS:  appropriate BACK:  Non-tender to palpation, No CVAT SKIN:  Warm, dry, and intact  Results: U/A: 3-10 RBC  CYSTOSCOPY  Procedure: Flexible cystoscopy  Pre-Operative Diagnosis:  Microscopic hematuria  Post-Operative  Diagnosis: Microscopic hematuria  Anesthesia: local with lidocaine gel  Surgical Narrative:  After appropriate informed consent was obtained, the patient was prepped and draped in the usual sterile fashion in the supine position. She was correctly identified and the proper procedure delineated prior to proceeding. Sterile lidocaine gel was instilled in the urethra.  The flexible cystoscope was introduced without difficulty.  Findings:  Urethra: Normal  Bladder: Normal  Ureteral orifices: normal  Additional findings: none  A bladder wash was not obtained for cytology.  Pelvic exam was positive for grade 2 cystocele.  She tolerated the procedure well.  A chaperone was present throughout the procedure.

## 2023-02-05 ENCOUNTER — Ambulatory Visit: Payer: 59 | Admitting: Physician Assistant

## 2023-02-18 ENCOUNTER — Ambulatory Visit: Payer: 59 | Attending: Internal Medicine | Admitting: Internal Medicine

## 2023-02-18 ENCOUNTER — Encounter: Payer: Self-pay | Admitting: Internal Medicine

## 2023-02-18 VITALS — BP 116/80 | HR 101 | Ht 62.0 in | Wt 190.2 lb

## 2023-02-18 DIAGNOSIS — E119 Type 2 diabetes mellitus without complications: Secondary | ICD-10-CM | POA: Diagnosis not present

## 2023-02-18 DIAGNOSIS — Z79899 Other long term (current) drug therapy: Secondary | ICD-10-CM | POA: Diagnosis not present

## 2023-02-18 DIAGNOSIS — I700 Atherosclerosis of aorta: Secondary | ICD-10-CM | POA: Diagnosis not present

## 2023-02-18 NOTE — Patient Instructions (Signed)
Medication Instructions:   *If you need a refill on your cardiac medications before your next appointment, please call your pharmacy*   Lab Work: Nmr, apo b, lipo a, hgba1c, vit D , esr, tsh  If you have labs (blood work) drawn today and your tests are completely normal, you will receive your results only by: MyChart Message (if you have MyChart) OR A paper copy in the mail If you have any lab test that is abnormal or we need to change your treatment, we will call you to review the results.   Testing/Procedures:    Follow-Up: At [facility], you and your health needs are our priority.  As part of our continuing mission to provide you with exceptional heart care, we have created designated Provider Care Teams.  These Care Teams include your primary Cardiologist (physician) and Advanced Practice Providers (APPs -  Physician Assistants and Nurse Practitioners) who all work together to provide you with the care you need, when you need it.  We recommend signing up for the patient portal called "MyChart".  Sign up information is provided on this After Visit Summary.  MyChart is used to connect with patients for Virtual Visits (Telemedicine).  Patients are able to view lab/test results, encounter notes, upcoming appointments, etc.  Non-urgent messages can be sent to your provider as well.   To learn more about what you can do with MyChart, go to [email or web address].    Your next appointment:  Fall 2025

## 2023-02-18 NOTE — Progress Notes (Signed)
Cardiology Office Note   Date:  02/18/2023   ID:  [name], DOB 07-23-1970, MRN [id number]  PCP:  Rema Fendt, NP  Cardiologist:   Dietrich Pates, MD   Pt referred for eval of atherosclerosis    History of Present Illness: Rachel Meza is a 52 y.o. 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   folic acid (FOLVITE) 1 MG tablet Take 1 tablet (1 mg total) by mouth daily.   methotrexate (RHEUMATREX) 2.5 MG tablet Take 8 tablets (20 mg total) by mouth once a week. Caution:Chemotherapy. Protect from light.     Allergies:   Patient has no known allergies.   Past Medical History:  Diagnosis Date   Blood transfusion without reported diagnosis    Diabetes mellitus without complication (HCC)    GERD (gastroesophageal reflux disease)    Hyperlipidemia     Past Surgical History:  Procedure Laterality Date   ABDOMINAL HYSTERECTOMY     CHOLECYSTECTOMY N/A 10/20/2018   Procedure: LAPAROSCOPIC CHOLECYSTECTOMY;  Surgeon: Kinsinger, De Blanch, MD;  Location: [facility] OR;  Service: General;  Laterality: N/A;   COLONOSCOPY     UTERINE FIBROID SURGERY       Social History:  The patient  reports that she has been smoking cigarettes. She has a 5 pack-year smoking history. She  has never been exposed to tobacco smoke. She has never used smokeless tobacco. She reports current alcohol use of about 3.0 standard drinks of alcohol per week. She reports that she does not use drugs.   Family History:  The patient's family history is not on file.    ROS:  Please see the history of present illness. All other systems are reviewed and  Negative to the above problem except as noted.    PHYSICAL EXAM: VS:  BP 116/80   Pulse (!) 101   Ht 5\' 2"  (1.575 m)   Wt 190 lb 3.2 oz (86.3 kg)   SpO2 97%   BMI 34.79 kg/m   GEN: Well nourished, well developed, in no acute distress  HEENT: normal  Neck: no JVD, carotid bruit Cardiac: RRR; no murmur  No Le  edema  Respiratory:  clear to auscultation  GI: soft, nontender, nondistended, + BS  No hepatomegaly   EKG:  EKG is ordered today.  Sinus tachycardia   101 bpm   LBBB  Nonspecific ST changes    Lipid Panel    Component Value Date/Time   CHOL 300 (H) 12/27/2020 1552   TRIG 99 12/27/2020 1552  HDL 69 12/27/2020 1552   CHOLHDL 4.3 12/27/2020 1552   LDLCALC 214 (H) 12/27/2020 1552      Wt Readings from Last 3 Encounters:  02/18/23 190 lb 3.2 oz (86.3 kg)  01/31/23 187 lb (84.8 kg)  01/24/23 189 lb (85.7 kg)      ASSESSMENT AND PLAN:  1  Atherosclerosis   Pt without symptoms   Will need to Rx risk factors  Continue to follow   2 HL  Familial HL   LDL in 2022 was 214  HDL 69   Pt does not know of other family members with high LDL   REcomm she inquire   Will check lipomed panel today   FOllow up    Will need Rx and close follow up     3  Hx RA  Follows in rheum clinic     4  Type 2 DM    Pt with A1C of 6.8 in 2022    Will recheck today    Reviewed diet   Needs to cut out sweet tea and carbs  Cutting back sugar should help with inflammation  Tentative follow up in 1 year  Will follow up with blood work   Current medicines are reviewed at length with the patient today.  The patient does not have concerns  regarding medicines.  Signed, Dietrich Pates, MD  02/18/2023 3:32 PM    [facility] [address] [place], [place], [place]  [address] Phone: [phone]; Fax: [phone]

## 2023-02-19 LAB — NMR, LIPOPROFILE
Cholesterol, Total: 318 mg/dL — ABNORMAL HIGH (ref 100–199)
HDL Particle Number: 38.8 umol/L
HDL-C: 71 mg/dL
LDL Particle Number: 2314 nmol/L — ABNORMAL HIGH
LDL Size: 21.5 nm
LDL-C (NIH Calc): 230 mg/dL — ABNORMAL HIGH (ref 0–99)
LP-IR Score: <25
Small LDL Particle Number: 709 nmol/L — ABNORMAL HIGH
Triglycerides: 101 mg/dL (ref 0–149)

## 2023-02-19 LAB — LIPOPROTEIN A (LPA): Lipoprotein (a): 151.3 nmol/L — ABNORMAL HIGH (ref <75.0)

## 2023-02-19 LAB — TSH: TSH: 3.07 u[IU]/mL (ref 0.450–4.500)

## 2023-02-19 LAB — APOLIPOPROTEIN B: Apolipoprotein B: 163 mg/dL — ABNORMAL HIGH

## 2023-02-19 LAB — SEDIMENTATION RATE: Sed Rate: 59 mm/h — ABNORMAL HIGH (ref 0–40)

## 2023-02-19 LAB — HEMOGLOBIN A1C
Est. average glucose Bld gHb Est-mCnc: 143 mg/dL
Hgb A1c MFr Bld: 6.6 % — ABNORMAL HIGH (ref 4.8–5.6)

## 2023-02-19 LAB — VITAMIN D 25 HYDROXY (VIT D DEFICIENCY, FRACTURES): Vit D, 25-Hydroxy: 18 ng/mL — ABNORMAL LOW (ref 30.0–100.0)

## 2023-02-25 ENCOUNTER — Other Ambulatory Visit: Payer: Self-pay

## 2023-02-25 ENCOUNTER — Telehealth: Payer: Self-pay

## 2023-02-25 DIAGNOSIS — Z79899 Other long term (current) drug therapy: Secondary | ICD-10-CM

## 2023-02-25 DIAGNOSIS — E782 Mixed hyperlipidemia: Secondary | ICD-10-CM

## 2023-02-25 MED ORDER — ROSUVASTATIN CALCIUM 40 MG PO TABS: 40.0000 mg | ORAL_TABLET | Freq: Every day | ORAL | 3 refills | Status: AC

## 2023-02-25 NOTE — Telephone Encounter (Signed)
Per Dr Tenny Craw:   Hgb A1C is 6.6  This is in diabetic range   Send to Ricky Stabs NP Recomm she cut back on sugar, carbs LDL is 230    Needs to be lower  to avoid development of coronary artery disease REcommend  Crestor 40 mg    Check lipomed and liver panel in 8 wks

## 2023-02-25 NOTE — Telephone Encounter (Signed)
Pt advised and verbalized understanding of her lab results will plan to have labs repeated mid-January.

## 2023-02-26 NOTE — Telephone Encounter (Signed)
Noted  

## 2023-03-23 ENCOUNTER — Other Ambulatory Visit: Payer: Self-pay | Admitting: Internal Medicine

## 2023-03-23 DIAGNOSIS — M549 Dorsalgia, unspecified: Secondary | ICD-10-CM

## 2023-03-24 MED ORDER — HYDROXYCHLOROQUINE SULFATE 200 MG PO TABS: 200.0000 mg | ORAL_TABLET | Freq: Every day | ORAL | 1 refills | Status: AC

## 2023-03-24 NOTE — Telephone Encounter (Signed)
Last Fill: 01/24/2023  Next Visit: 04/25/2023  Last Visit: 01/24/2023  Dx: Pain in right hip   Current Dose per office note on 01/24/2023: Flexeril 5 mg at night as needed   Okay to refill Flexeril?

## 2023-03-24 NOTE — Progress Notes (Signed)
Sedimentation rate and is elevated at 34.  Blood count and metabolic panel are fine no problem for continuing methotrexate.  I recommend she start on the hydroxychloroquine as discussed with taking this 1 tablet daily.  We can recheck her symptoms and lab test at our follow-up to see if the medicine is helping or any issues with it.

## 2023-03-24 NOTE — Addendum Note (Signed)
Addended by: Fuller Plan on: 03/24/2023 01:45 PM   Modules accepted: Orders

## 2023-04-11 NOTE — Progress Notes (Deleted)
Office Visit Note  Patient: Rachel Meza             Date of Birth: Jun 29, 1970           MRN: [id number]             PCP: Rema Fendt, NP Referring: Rema Fendt, NP Visit Date: 04/25/2023   Subjective:  No chief complaint on file.   History of Present Illness: Lanyla Costello is a 53 y.o. female here for follow up for seropositive RA on methotrexate 20 mg p.o. weekly, folic acid 1 mg daily, and hydroxychloroquine 200 mg daily.   Previous HPI 01/24/2023 Cire Deyarmin is a 53 y.o. female here for follow up for seropositive RA on methotrexate 20 mg p.o. weekly folic acid 1 mg daily.  She continues to have some worsening bilateral hand pain and stiffness especially bothersome at night and keeps her from sleeping sometimes.  She is not having any particular numbness or loss of grip strength although it is painful to close her fist tightly.  She notices some persistent swelling throughout the hands although reports it is still much less than her initial symptoms   Previous HPI 10/18/2022 Neriyah Cercone is a 53 y.o. female here for follow up for seropositive RA on methotrexate 20 mg p.o. weekly folic acid 1 mg daily.  Currently joint pain and stiffness has been pretty stable does not experience any major flareups and is not seeing a lot of visible swelling recently.  Still has some pain and stiffness in her hands with occasional numbness but not having any trouble with grip or dropping items.  Has some chronic pain across the upper back between her neck and shoulders.  Also with ongoing right hip pain sometimes radiating most often bothers her if she lies on the right side.   Previous HPI 02/19/2021 Anihya Tuma is a 53 y.o. female here for follow up with multiple joint and body pains with positive ANA, RF, and elevated ESR. Labs at initial visit showed low positive RNP 1.8 other ENA tests negative.  There have been no significant change in symptoms since our last visit.  She reports  some episodic low back pain but not ongoing at this time.  No large increase in joint swelling erythema or range of motion change.   Previous HPI 02/02/21 Rada Zegers is a 53 y.o. female here for evaluation of generalized body pains with abnormal labs including positive ANA, positive RF, and elevated sedimentation rate. She started having pain and numbness symptoms in her bilateral hands since around 2008 with no specific onset or associated events that she can recall. This occasionally worsened with pain radiating up the arm and wrist as well. She has noticed increase in pain in her shoulders and knees during the past few years also gradual onset and progression. She experiences some swelling in her feet and toes but not elsewhere at involved areas. The hands often also feel numb sometimes throughout sometimes limited to specific fingers. She occasionally loses her grip spontaneously but has normal grip strength much of the time.  She has not noticed any significant discoloration. She does not take any medications for these problems she is not sure what would be useful. She has no past major joint injury or surgery.   Labs reviewed 12/2020 ANA pos RF 70.9 ESR 56 CBC wnl CMP wnl Hgb A1c 6.8% HCV neg BV pos   No Rheumatology ROS completed.   PMFS History:  Patient Active Problem List  Diagnosis Date Noted   Nephrolithiasis 12/20/2022   Microscopic hematuria 12/20/2022   Upper back pain 10/18/2022   Pain in right hip 07/19/2022   High risk medication use 08/20/2021   Seropositive rheumatoid arthritis (HCC) 02/02/2021   Positive ANA (antinuclear antibody) 02/02/2021   Diabetes (HCC) 12/28/2020   Bacterial vaginitis 12/28/2020   Cholecystitis 10/20/2018    Past Medical History:  Diagnosis Date   Blood transfusion without reported diagnosis    Diabetes mellitus without complication (HCC)    GERD (gastroesophageal reflux disease)    Hyperlipidemia     Family History  Problem  Relation Age of Onset   Colon cancer Neg Hx    Colon polyps Neg Hx    Esophageal cancer Neg Hx    Rectal cancer Neg Hx    Stomach cancer Neg Hx    Past Surgical History:  Procedure Laterality Date   ABDOMINAL HYSTERECTOMY     CHOLECYSTECTOMY N/A 10/20/2018   Procedure: LAPAROSCOPIC CHOLECYSTECTOMY;  Surgeon: Kinsinger, De Blanch, MD;  Location: [facility] OR;  Service: General;  Laterality: N/A;   COLONOSCOPY     UTERINE FIBROID SURGERY     Social History   Social History Narrative   Not on file   Immunization History  Administered Date(s) Administered   Influenza-Unspecified 11/03/2020   PFIZER(Purple Top)SARS-COV-2 Vaccination 09/07/2020   Pneumococcal Polysaccharide-23 10/21/2018     Objective: Vital Signs: There were no vitals taken for this visit.   Physical Exam   Musculoskeletal Exam: ***  CDAI Exam: CDAI Score: -- Patient Global: --; Provider Global: -- Swollen: --; Tender: -- Joint Exam 04/25/2023   No joint exam has been documented for this visit   There is currently no information documented on the homunculus. Go to the Rheumatology activity and complete the homunculus joint exam.  Investigation: No additional findings.  Imaging: No results found.  Recent Labs: Lab Results  Component Value Date   WBC 5.7 01/24/2023   HGB 13.5 01/24/2023   PLT 363 01/24/2023   NA 139 01/24/2023   K 4.3 01/24/2023   CL 106 01/24/2023   CO2 25 01/24/2023   GLUCOSE 112 (H) 01/24/2023   BUN 14 01/24/2023   CREATININE 0.73 01/24/2023   BILITOT 0.2 01/24/2023   ALKPHOS 90 11/18/2022   AST 15 01/24/2023   ALT 20 01/24/2023   PROT 7.4 01/24/2023   ALBUMIN 3.8 11/18/2022   CALCIUM 9.7 01/24/2023   GFRAA >60 10/19/2018   QFTBGOLDPLUS NEGATIVE 08/20/2021    Speciality Comments: No specialty comments available.  Procedures:  No procedures performed Allergies: Patient has no known allergies.   Assessment / Plan:     Visit Diagnoses: No diagnosis  found.  ***  Orders: No orders of the defined types were placed in this encounter.  No orders of the defined types were placed in this encounter.    Follow-Up Instructions: No follow-ups on file.   Metta Clines, RT  Note - This record has been created using [employer].  Chart creation errors have been sought, but may not always  have been located. Such creation errors do not reflect on  the standard of medical care.

## 2023-04-17 ENCOUNTER — Other Ambulatory Visit: Payer: Self-pay

## 2023-04-17 ENCOUNTER — Ambulatory Visit (INDEPENDENT_AMBULATORY_CARE_PROVIDER_SITE_OTHER): Payer: 59 | Admitting: Physician Assistant

## 2023-04-17 ENCOUNTER — Encounter: Payer: Self-pay | Admitting: Sports Medicine

## 2023-04-17 ENCOUNTER — Ambulatory Visit (INDEPENDENT_AMBULATORY_CARE_PROVIDER_SITE_OTHER): Payer: 59 | Admitting: Sports Medicine

## 2023-04-17 ENCOUNTER — Ambulatory Visit (INDEPENDENT_AMBULATORY_CARE_PROVIDER_SITE_OTHER): Payer: Self-pay

## 2023-04-17 DIAGNOSIS — M25551 Pain in right hip: Secondary | ICD-10-CM | POA: Diagnosis not present

## 2023-04-17 DIAGNOSIS — M1611 Unilateral primary osteoarthritis, right hip: Secondary | ICD-10-CM

## 2023-04-17 MED ORDER — METHYLPREDNISOLONE ACETATE 40 MG/ML IJ SUSP
40.0000 mg | INTRAMUSCULAR | Status: AC | PRN
  Administered 2023-04-17: 40 mg via INTRA_ARTICULAR

## 2023-04-17 MED ORDER — LIDOCAINE HCL 1 % IJ SOLN
4.0000 mL | INTRAMUSCULAR | Status: AC | PRN
  Administered 2023-04-17: 4 mL

## 2023-04-17 NOTE — Progress Notes (Signed)
 Office Visit Note   Patient: Rachel Meza           Date of Birth: 16-Sep-1970           MRN: [id number] Visit Date: 04/17/2023              Requested by: Lorren Greig PARAS, NP [address] [place],  [place] [address] PCP: Lorren Greig PARAS, NP   Assessment & Plan: Visit Diagnoses:  1. Pain in right hip     Plan: Pleasant 53 year old woman comes in today with right hip pain.  She has been diagnosed with rheumatoid arthritis.  She has had about an ongoing 1 year history of right hip pain no injury.  Denies any pain in her left hip.  X-rays today do demonstrate degenerative changes of both hip joints though the right is slightly worse than the left.  She has pain with logrolling of her hip.  She does not have any radicular findings she does have some arthritis in her spine but I think pain today is isolated to the right hip.  Will refer her to Dr. Burnetta for an injection into the right hip  Follow-Up Instructions: No follow-ups on file.   Orders:  Orders Placed This Encounter  Procedures   XR HIP UNILAT W OR W/O PELVIS 2-3 VIEWS RIGHT   No orders of the defined types were placed in this encounter.     Procedures: No procedures performed   Clinical Data: No additional findings.   Subjective: Chief Complaint  Patient presents with   Right Hip - Pain    HPI pleasant 53 year old woman with a chief complaint of right hip pain.  She said this been going on about a year.  Last A1c was under 7.  She denies any injury denies any fever chills denies any left-sided pain denies any radicular findings  Review of Systems  All other systems reviewed and are negative.    Objective: Vital Signs: There were no vitals taken for this visit.  Physical Exam Constitutional:      Appearance: Normal appearance.  Pulmonary:     Effort: Pulmonary effort is normal.  Skin:    General: Skin is warm and dry.  Neurological:     General: No focal deficit present.     Mental  Status: She is alert and oriented to person, place, and time.  Psychiatric:        Mood and Affect: Mood normal.        Behavior: Behavior normal.     Ortho Exam Examination no low back pain no paresthesias she is neurovascularly intact.  She does have tenderness in the right groin which is replicated with logrolling of her hip Specialty Comments:  No specialty comments available.  Imaging: No results found.   PMFS History: Patient Active Problem List   Diagnosis Date Noted   Nephrolithiasis 12/20/2022   Microscopic hematuria 12/20/2022   Upper back pain 10/18/2022   Pain in right hip 07/19/2022   High risk medication use 08/20/2021   Seropositive rheumatoid arthritis (HCC) 02/02/2021   Positive ANA (antinuclear antibody) 02/02/2021   Diabetes (HCC) 12/28/2020   Bacterial vaginitis 12/28/2020   Cholecystitis 10/20/2018   Past Medical History:  Diagnosis Date   Blood transfusion without reported diagnosis    Diabetes mellitus without complication (HCC)    GERD (gastroesophageal reflux disease)    Hyperlipidemia     Family History  Problem Relation Age of Onset   Colon cancer Neg  Hx    Colon polyps Neg Hx    Esophageal cancer Neg Hx    Rectal cancer Neg Hx    Stomach cancer Neg Hx     Past Surgical History:  Procedure Laterality Date   ABDOMINAL HYSTERECTOMY     CHOLECYSTECTOMY N/A 10/20/2018   Procedure: LAPAROSCOPIC CHOLECYSTECTOMY;  Surgeon: Kinsinger, Herlene Righter, MD;  Location: [facility] OR;  Service: General;  Laterality: N/A;   COLONOSCOPY     UTERINE FIBROID SURGERY     Social History   Occupational History   Not on file  Tobacco Use   Smoking status: Every Day    Current packs/day: 0.50    Average packs/day: 0.5 packs/day for 10.0 years (5.0 ttl pk-yrs)    Types: Cigarettes    Passive exposure: Never   Smokeless tobacco: Never  Vaping Use   Vaping status: Never Used  Substance and Sexual Activity   Alcohol use: Yes    Alcohol/week: 3.0 standard  drinks of alcohol    Types: 3 Standard drinks or equivalent per week    Comment: occ   Drug use: Never   Sexual activity: Not on file    Comment: Tubal ligation

## 2023-04-17 NOTE — Progress Notes (Signed)
   Procedure Note  Patient: Rachel Meza             Date of Birth: Sep 01, 1970           MRN: [id number]             Visit Date: 04/17/2023  Procedures: Visit Diagnoses:  1. Pain in right hip   2. Unilateral primary osteoarthritis, right hip    Large Joint Inj: R hip joint on 04/17/2023 9:05 AM Indications: pain Details: 22 G 3.5 in needle, ultrasound-guided anterior approach Medications: 4 mL lidocaine  1 %; 40 mg methylPREDNISolone  acetate 40 MG/ML Outcome: tolerated well, no immediate complications  Procedure: US -guided intra-articular hip injection, Right After discussion on risks/benefits/indications and informed verbal consent was obtained, a timeout was performed. Patient was lying supine on exam table. The hip was cleaned with betadine and alcohol swabs. Then utilizing ultrasound guidance, the patient's femoral head and neck junction was identified and subsequently injected with 4:1 lidocaine :depomedrol via an in-plane approach with ultrasound visualization of the injectate administered into the hip joint. Patient tolerated procedure well without immediate complications.  Procedure, treatment alternatives, risks and benefits explained, specific risks discussed. Consent was given by the patient. Immediately prior to procedure a time out was called to verify the correct patient, procedure, equipment, support staff and site/side marked as required. Patient was prepped and draped in the usual sterile fashion.     - follow-up with Rachel Meza as indicated; I am happy to see them as needed  Lonell Sprang, DO Primary Care Sports Medicine Physician  [facility]  This note was dictated using Dragon naturally speaking software and may contain errors in syntax, spelling, or content which have not been identified prior to signing this note.

## 2023-04-25 ENCOUNTER — Ambulatory Visit: Payer: 59 | Admitting: Internal Medicine

## 2023-04-25 DIAGNOSIS — M25551 Pain in right hip: Secondary | ICD-10-CM

## 2023-04-25 DIAGNOSIS — Z79899 Other long term (current) drug therapy: Secondary | ICD-10-CM

## 2023-04-25 DIAGNOSIS — M059 Rheumatoid arthritis with rheumatoid factor, unspecified: Secondary | ICD-10-CM

## 2023-04-25 DIAGNOSIS — R109 Unspecified abdominal pain: Secondary | ICD-10-CM

## 2023-04-25 NOTE — Progress Notes (Deleted)
Office Visit Note  Patient: Rachel Meza             Date of Birth: 20-Sep-1970           MRN: [id number]             PCP: Rema Fendt, NP Referring: Rema Fendt, NP Visit Date: 05/06/2023   Subjective:  No chief complaint on file.   History of Present Illness: Angelee Dreis is a 53 y.o. female here for follow up for seropositive RA on methotrexate 20 mg p.o. weekly, folic acid 1 mg daily, and hydroxychloroquine 200 mg daily.   Previous HPI 01/24/2023 Riyanshi Oldfield is a 53 y.o. female here for follow up for seropositive RA on methotrexate 20 mg p.o. weekly folic acid 1 mg daily.  She continues to have some worsening bilateral hand pain and stiffness especially bothersome at night and keeps her from sleeping sometimes.  She is not having any particular numbness or loss of grip strength although it is painful to close her fist tightly.  She notices some persistent swelling throughout the hands although reports it is still much less than her initial symptoms   Previous HPI 10/18/2022 Lamarr Mcquate is a 53 y.o. female here for follow up for seropositive RA on methotrexate 20 mg p.o. weekly folic acid 1 mg daily.  Currently joint pain and stiffness has been pretty stable does not experience any major flareups and is not seeing a lot of visible swelling recently.  Still has some pain and stiffness in her hands with occasional numbness but not having any trouble with grip or dropping items.  Has some chronic pain across the upper back between her neck and shoulders.  Also with ongoing right hip pain sometimes radiating most often bothers her if she lies on the right side.   Previous HPI 02/19/2021 Tabbitha Rosevear is a 53 y.o. female here for follow up with multiple joint and body pains with positive ANA, RF, and elevated ESR. Labs at initial visit showed low positive RNP 1.8 other ENA tests negative.  There have been no significant change in symptoms since our last visit.  She reports  some episodic low back pain but not ongoing at this time.  No large increase in joint swelling erythema or range of motion change.   Previous HPI 02/02/21 Tashawn Gressel is a 53 y.o. female here for evaluation of generalized body pains with abnormal labs including positive ANA, positive RF, and elevated sedimentation rate. She started having pain and numbness symptoms in her bilateral hands since around 2008 with no specific onset or associated events that she can recall. This occasionally worsened with pain radiating up the arm and wrist as well. She has noticed increase in pain in her shoulders and knees during the past few years also gradual onset and progression. She experiences some swelling in her feet and toes but not elsewhere at involved areas. The hands often also feel numb sometimes throughout sometimes limited to specific fingers. She occasionally loses her grip spontaneously but has normal grip strength much of the time.  She has not noticed any significant discoloration. She does not take any medications for these problems she is not sure what would be useful. She has no past major joint injury or surgery.   Labs reviewed 12/2020 ANA pos RF 70.9 ESR 56 CBC wnl CMP wnl Hgb A1c 6.8% HCV neg BV pos   No Rheumatology ROS completed.   PMFS History:  Patient Active Problem List  Diagnosis Date Noted   Nephrolithiasis 12/20/2022   Microscopic hematuria 12/20/2022   Upper back pain 10/18/2022   Pain in right hip 07/19/2022   High risk medication use 08/20/2021   Seropositive rheumatoid arthritis (HCC) 02/02/2021   Positive ANA (antinuclear antibody) 02/02/2021   Diabetes (HCC) 12/28/2020   Bacterial vaginitis 12/28/2020   Cholecystitis 10/20/2018    Past Medical History:  Diagnosis Date   Blood transfusion without reported diagnosis    Diabetes mellitus without complication (HCC)    GERD (gastroesophageal reflux disease)    Hyperlipidemia     Family History  Problem  Relation Age of Onset   Colon cancer Neg Hx    Colon polyps Neg Hx    Esophageal cancer Neg Hx    Rectal cancer Neg Hx    Stomach cancer Neg Hx    Past Surgical History:  Procedure Laterality Date   ABDOMINAL HYSTERECTOMY     CHOLECYSTECTOMY N/A 10/20/2018   Procedure: LAPAROSCOPIC CHOLECYSTECTOMY;  Surgeon: Kinsinger, De Blanch, MD;  Location: [facility];  Service: General;  Laterality: N/A;   COLONOSCOPY     UTERINE FIBROID SURGERY     Social History   Social History Narrative   Not on file   Immunization History  Administered Date(s) Administered   Influenza-Unspecified 11/03/2020   PFIZER(Purple Top)SARS-COV-2 Vaccination 09/07/2020   Pneumococcal Polysaccharide-23 10/21/2018     Objective: Vital Signs: There were no vitals taken for this visit.   Physical Exam   Musculoskeletal Exam: ***  CDAI Exam: CDAI Score: -- Patient Global: --; Provider Global: -- Swollen: --; Tender: -- Joint Exam 05/06/2023   No joint exam has been documented for this visit   There is currently no information documented on the homunculus. Go to the Rheumatology activity and complete the homunculus joint exam.  Investigation: No additional findings.  Imaging: XR HIP UNILAT W OR W/O PELVIS 2-3 VIEWS RIGHT Result Date: 04/17/2023 Radiographs of her right hip were obtained today.  She does have sclerotic changes and joint space narrowing especially in the superior aspect of the femoral acetabular joint no acute fractures are noted   Recent Labs: Lab Results  Component Value Date   WBC 5.7 01/24/2023   HGB 13.5 01/24/2023   PLT 363 01/24/2023   NA 139 01/24/2023   K 4.3 01/24/2023   CL 106 01/24/2023   CO2 25 01/24/2023   GLUCOSE 112 (H) 01/24/2023   BUN 14 01/24/2023   CREATININE 0.73 01/24/2023   BILITOT 0.2 01/24/2023   ALKPHOS 90 11/18/2022   AST 15 01/24/2023   ALT 20 01/24/2023   PROT 7.4 01/24/2023   ALBUMIN 3.8 11/18/2022   CALCIUM 9.7 01/24/2023   GFRAA >60  10/19/2018   QFTBGOLDPLUS NEGATIVE 08/20/2021    Speciality Comments: No specialty comments available.  Procedures:  No procedures performed Allergies: Patient has no known allergies.   Assessment / Plan:     Visit Diagnoses: No diagnosis found.  ***  Orders: No orders of the defined types were placed in this encounter.  No orders of the defined types were placed in this encounter.    Follow-Up Instructions: No follow-ups on file.   Metta Clines, RT  Note - This record has been created using [employer].  Chart creation errors have been sought, but may not always  have been located. Such creation errors do not reflect on  the standard of medical care.

## 2023-04-27 ENCOUNTER — Other Ambulatory Visit: Payer: Self-pay | Admitting: Internal Medicine

## 2023-04-27 DIAGNOSIS — M549 Dorsalgia, unspecified: Secondary | ICD-10-CM

## 2023-04-28 NOTE — Telephone Encounter (Signed)
Last Fill: 03/24/2023   Next Visit: 04/25/2023   Last Visit: 01/24/2023   Dx: Pain in right hip    Current Dose per office note on 01/24/2023: Flexeril 5 mg at night as needed    Okay to refill Flexeril?

## 2023-05-06 ENCOUNTER — Ambulatory Visit: Payer: 59 | Admitting: Internal Medicine

## 2023-05-06 DIAGNOSIS — R109 Unspecified abdominal pain: Secondary | ICD-10-CM

## 2023-05-06 DIAGNOSIS — M059 Rheumatoid arthritis with rheumatoid factor, unspecified: Secondary | ICD-10-CM

## 2023-05-06 DIAGNOSIS — M25551 Pain in right hip: Secondary | ICD-10-CM

## 2023-05-06 DIAGNOSIS — Z79899 Other long term (current) drug therapy: Secondary | ICD-10-CM

## 2023-05-20 ENCOUNTER — Other Ambulatory Visit: Payer: Self-pay | Admitting: Internal Medicine

## 2023-05-20 DIAGNOSIS — M059 Rheumatoid arthritis with rheumatoid factor, unspecified: Secondary | ICD-10-CM

## 2023-05-26 ENCOUNTER — Other Ambulatory Visit: Payer: Self-pay | Admitting: Internal Medicine

## 2023-05-26 DIAGNOSIS — M059 Rheumatoid arthritis with rheumatoid factor, unspecified: Secondary | ICD-10-CM

## 2023-05-31 ENCOUNTER — Other Ambulatory Visit: Payer: Self-pay | Admitting: Internal Medicine

## 2023-05-31 DIAGNOSIS — M549 Dorsalgia, unspecified: Secondary | ICD-10-CM

## 2023-06-22 ENCOUNTER — Other Ambulatory Visit: Payer: Self-pay | Admitting: Internal Medicine

## 2023-06-22 DIAGNOSIS — M059 Rheumatoid arthritis with rheumatoid factor, unspecified: Secondary | ICD-10-CM

## 2023-08-01 ENCOUNTER — Ambulatory Visit: Payer: 59 | Admitting: Urology

## 2023-08-04 ENCOUNTER — Ambulatory Visit: Payer: 59 | Admitting: Urology

## 2023-08-04 NOTE — Progress Notes (Deleted)
 Assessment: 1. Microscopic hematuria   2. Nephrolithiasis     Plan: No serious or life-threatening causes for the microscopic hematuria have been identified. The right sided kidney stone has been present for at least 4 years and is stable in size and in location.  I do not recommend any treatment for the stone at this time. Stone prevention discussed. Recommend follow-up in 6 months.  Chief Complaint:  No chief complaint on file.   History of Present Illness:  Rachel Meza is a 53 y.o. female who is seen for further evaluation of microscopic hematuria and nephrolithiasis. She was seen in the emergency room on 11/18/2022 with left flank/back pain.  Urinalysis demonstrated 0-5 RBCs, 0-5 WBCs. CT imaging from 11/18/22 showed a 4 mm calculus in the right lower pole without evidence of obstruction.  No other calculi seen. Prior CT imaging from 7/20 showed a 4-5 mm calculus in the right lower pole without obstruction. Her left-sided pain  improved. She has baseline symptoms of frequency, urgency, nocturia x 2, and occasional urge incontinence.  No dysuria or gross hematuria. She does have history of tobacco use smoking 2 packs/week for approximately 20 years. CxBladder was 0.09 (normal). Cystoscopy from 10/24 showed no urethral or bladder abnormalities.  Pelvic exam demonstrated a grade 2 cystocele.    Portions of the above documentation were copied from a prior visit for review purposes only.   Past Medical History:  Past Medical History:  Diagnosis Date   Blood transfusion without reported diagnosis    Diabetes mellitus without complication (HCC)    GERD (gastroesophageal reflux disease)    Hyperlipidemia     Past Surgical History:  Past Surgical History:  Procedure Laterality Date   ABDOMINAL HYSTERECTOMY     CHOLECYSTECTOMY N/A 10/20/2018   Procedure: LAPAROSCOPIC CHOLECYSTECTOMY;  Surgeon: Derral Flick, MD;  Location: [facility] OR;  Service: General;  Laterality:  N/A;   COLONOSCOPY     UTERINE FIBROID SURGERY      Allergies:  No Known Allergies  Family History:  Family History  Problem Relation Age of Onset   Colon cancer Neg Hx    Colon polyps Neg Hx    Esophageal cancer Neg Hx    Rectal cancer Neg Hx    Stomach cancer Neg Hx     Social History:  Social History   Tobacco Use   Smoking status: Every Day    Current packs/day: 0.50    Average packs/day: 0.5 packs/day for 10.0 years (5.0 ttl pk-yrs)    Types: Cigarettes    Passive exposure: Never   Smokeless tobacco: Never  Vaping Use   Vaping status: Never Used  Substance Use Topics   Alcohol use: Yes    Alcohol/week: 3.0 standard drinks of alcohol    Types: 3 Standard drinks or equivalent per week    Comment: occ   Drug use: Never    ROS: Constitutional:  Negative for fever, chills, weight loss CV: Negative for chest pain, previous MI, hypertension Respiratory:  Negative for shortness of breath, wheezing, sleep apnea, frequent cough GI:  Negative for nausea, vomiting, bloody stool, GERD  Physical exam: There were no vitals taken for this visit. GENERAL APPEARANCE:  Well appearing, well developed, well nourished, NAD HEENT:  Atraumatic, normocephalic, oropharynx clear NECK:  Supple without lymphadenopathy or thyromegaly ABDOMEN:  Soft, non-tender, no masses EXTREMITIES:  Moves all extremities well, without clubbing, cyanosis, or edema NEUROLOGIC:  Alert and oriented x 3, normal gait, CN II-XII grossly intact MENTAL  STATUS:  appropriate BACK:  Non-tender to palpation, No CVAT SKIN:  Warm, dry, and intact  Results: U/A:

## 2023-08-31 IMAGING — MG MM DIGITAL DIAGNOSTIC UNILAT*R* W/ TOMO W/ CAD
6 series · 6 of 18 positions shown · non-contrast
Comparison: Previous exam(s).

CLINICAL DATA: 51-year-old female presenting for first six-month
follow-up of a probably benign right breast asymmetry and probably
benign, borderline right axillary lymphadenopathy. The patient was
recently diagnosed with rheumatoid arthritis.

EXAM:
DIGITAL DIAGNOSTIC UNILATERAL RIGHT MAMMOGRAM WITH TOMOSYNTHESIS AND
CAD; US AXILLARY RIGHT
TECHNIQUE: Right digital diagnostic mammography and breast tomosynthesis was
performed. The images were evaluated with computer-aided detection.;
Targeted ultrasound examination of the right axilla was performed.

[R MLO synth-2D]
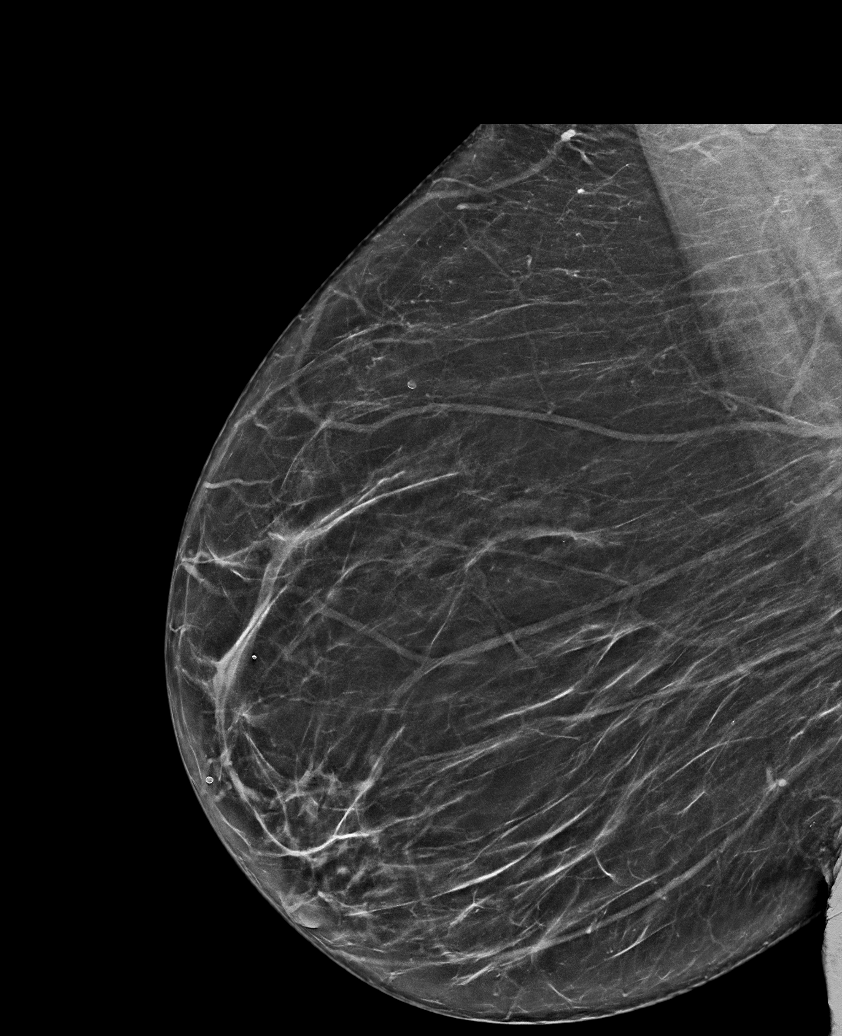

[R CC synth-2D (1 of 2)]
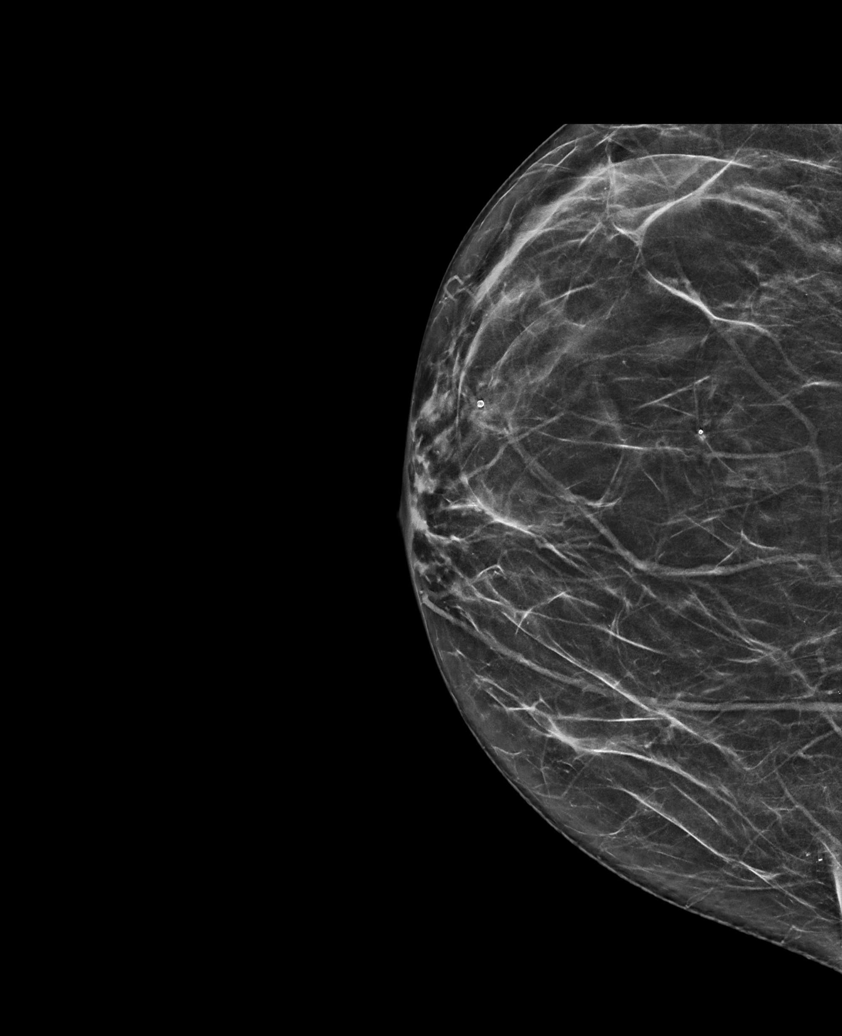

[R CC synth-2D (2 of 2)]
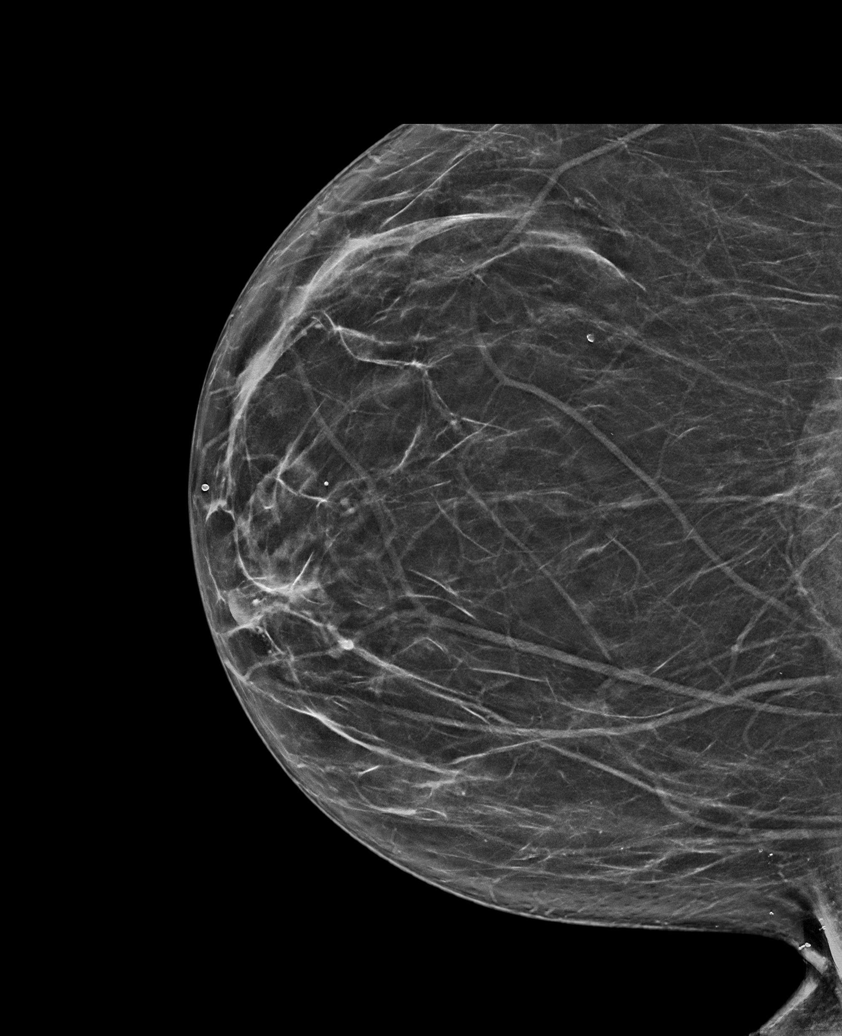

[R CC tomo (1 of 2) · tomo slice 35/70.0]
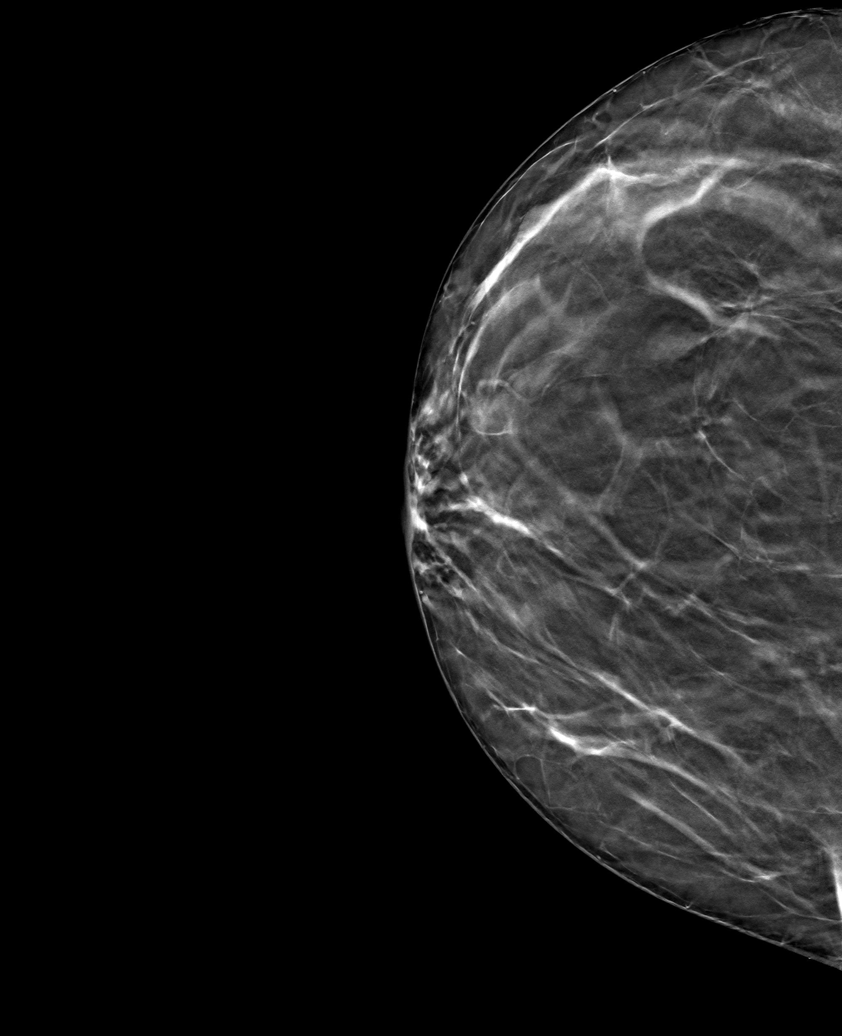

[R CC tomo (2 of 2) · tomo slice 36/71.0]
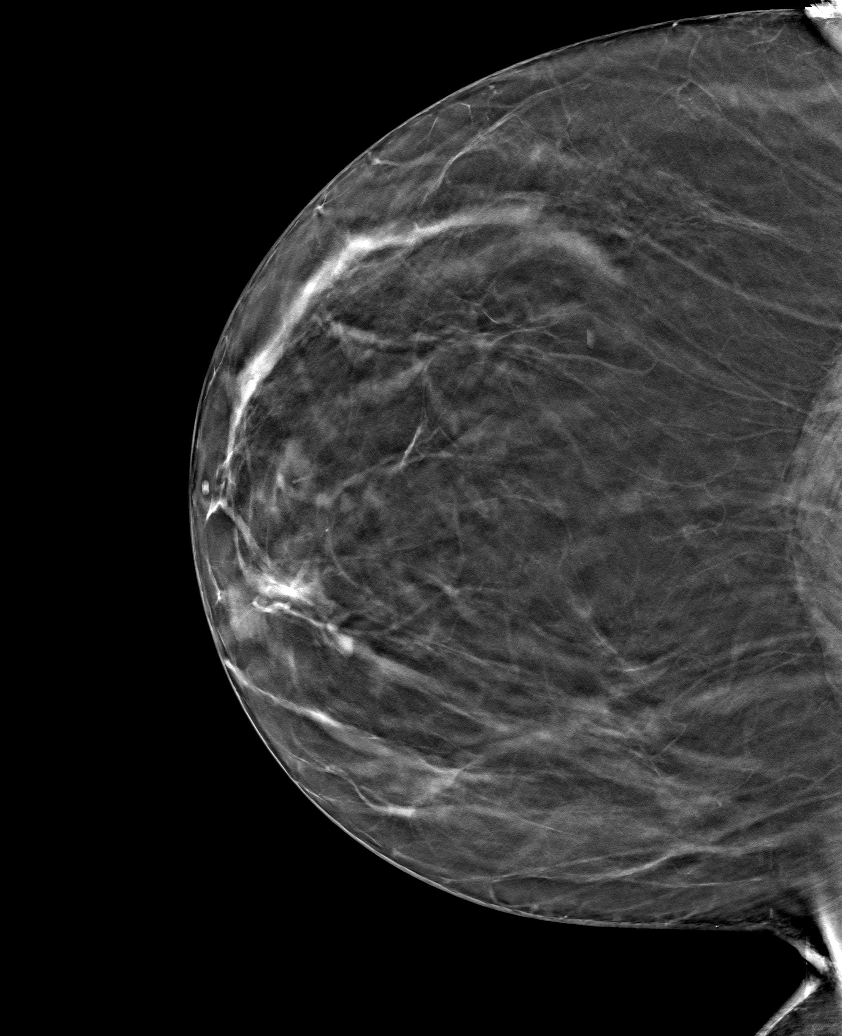

[R MLO tomo · tomo slice 39/78.0]
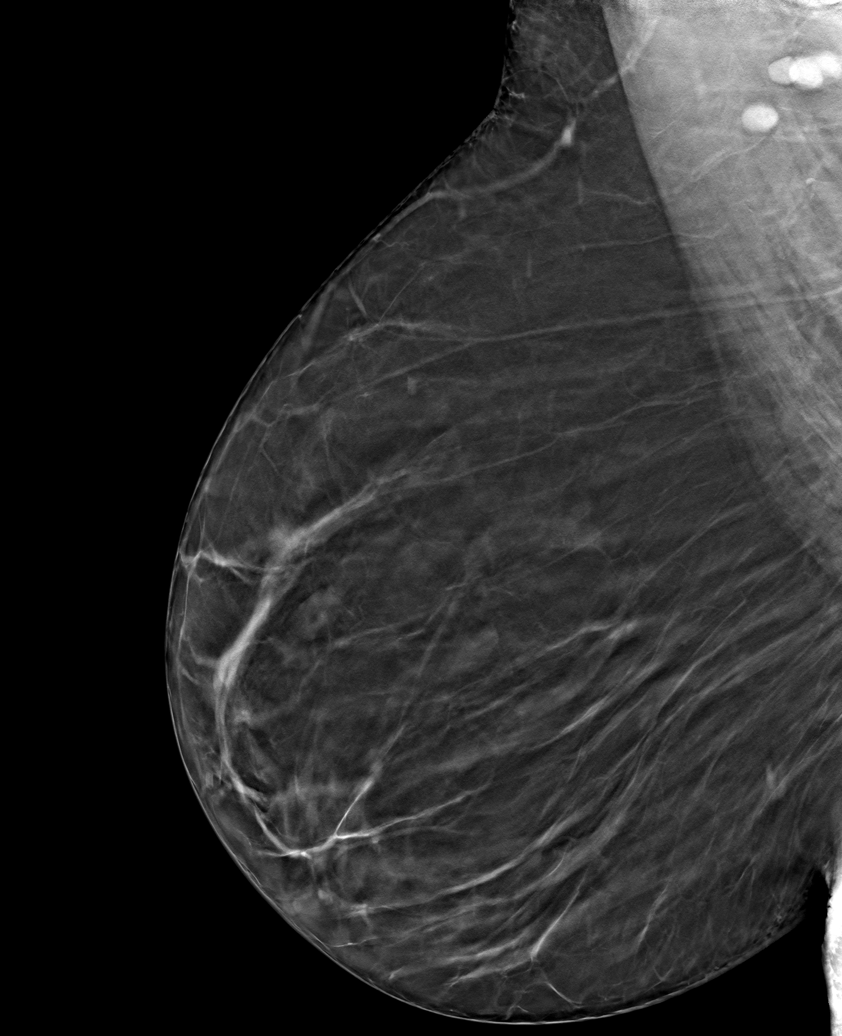

[6 of 18 positions shown; findings below may reference images not displayed]

ACR Breast Density Category b: There are scattered areas of
fibroglandular density.
FINDINGS: An asymmetry in the central right breast at mid depth on the MLO
projection is no longer seen on today's mammographic views.
Otherwise, no new or suspicious findings in the right breast.

Targeted ultrasound is performed, showing stable appearance of
borderline cortical thickening of multiple right axillary lymph
nodes between 2-4 mm. Given the interval stability, this is felt to
represent the normal appearance for the patient's lymph nodes.
IMPRESSION: No mammographic or sonographic evidence of malignancy in the right
breast.

RECOMMENDATION:
The patient may resume routine annual screening, due in February 2022.

I have discussed the findings and recommendations with the patient.
If applicable, a reminder letter will be sent to the patient
regarding the next appointment.

BI-RADS CATEGORY  2: Benign.

## 2024-02-09 ENCOUNTER — Encounter: Payer: Self-pay | Admitting: Radiology

## 2024-03-19 ENCOUNTER — Emergency Department (HOSPITAL_COMMUNITY): Payer: Self-pay

## 2024-03-19 ENCOUNTER — Encounter (HOSPITAL_COMMUNITY): Payer: Self-pay

## 2024-03-19 ENCOUNTER — Other Ambulatory Visit: Payer: Self-pay

## 2024-03-19 ENCOUNTER — Emergency Department (HOSPITAL_COMMUNITY)
Admission: EM | Admit: 2024-03-19 | Discharge: 2024-03-19 | Disposition: A | Discharge status: Home / Self Care | Payer: Self-pay | Attending: Emergency Medicine | Admitting: Emergency Medicine

## 2024-03-19 DIAGNOSIS — X500XXA Overexertion from strenuous movement or load, initial encounter: Secondary | ICD-10-CM | POA: Insufficient documentation

## 2024-03-19 DIAGNOSIS — M546 Pain in thoracic spine: Secondary | ICD-10-CM | POA: Insufficient documentation

## 2024-03-19 DIAGNOSIS — Y990 Civilian activity done for income or pay: Secondary | ICD-10-CM | POA: Insufficient documentation

## 2024-03-19 LAB — BASIC METABOLIC PANEL WITH GFR
Anion gap: 10 (ref 5–15)
BUN: 8 mg/dL (ref 6–20)
CO2: 24 mmol/L (ref 22–32)
Calcium: 9.3 mg/dL (ref 8.9–10.3)
Chloride: 102 mmol/L (ref 98–111)
Creatinine, Ser: 0.6 mg/dL (ref 0.44–1.00)
GFR, Estimated: >60 mL/min (ref >60)
Glucose, Bld: 102 mg/dL — ABNORMAL HIGH (ref 70–99)
Potassium: 3.9 mmol/L (ref 3.5–5.1)
Sodium: 136 mmol/L (ref 135–145)

## 2024-03-19 LAB — CBC
HCT: 42.4 % (ref 36.0–46.0)
Hemoglobin: 13.9 g/dL (ref 12.0–15.0)
MCH: 28.7 pg (ref 26.0–34.0)
MCHC: 32.8 g/dL (ref 30.0–36.0)
MCV: 87.6 fL (ref 80.0–100.0)
Platelets: 326 K/uL (ref 150–400)
RBC: 4.84 MIL/uL (ref 3.87–5.11)
RDW: 13.4 % (ref 11.5–15.5)
WBC: 8 K/uL (ref 4.0–10.5)
nRBC: 0 % (ref 0.0–0.2)

## 2024-03-19 LAB — TROPONIN I (HIGH SENSITIVITY): Troponin I (High Sensitivity): 4 ng/L (ref <18)

## 2024-03-19 MED ORDER — KETOROLAC TROMETHAMINE 60 MG/2ML IM SOLN
60.0000 mg | Freq: Once | INTRAMUSCULAR | Status: AC
  Administered 2024-03-19: 60 mg via INTRAMUSCULAR
  Filled 2024-03-19: qty 2

## 2024-03-19 MED ORDER — MELOXICAM 15 MG PO TABS: 15.0000 mg | ORAL_TABLET | Freq: Every day | ORAL | 0 refills | Status: AC

## 2024-03-19 MED ORDER — METHOCARBAMOL 500 MG PO TABS: 500.0000 mg | ORAL_TABLET | Freq: Two times a day (BID) | ORAL | 0 refills | Status: AC | PRN

## 2024-03-19 NOTE — ED Triage Notes (Signed)
 Pt c.o mid to left upper back pain that radiates to her chest since Wednesday. Pt denies SOB

## 2024-03-19 NOTE — Discharge Instructions (Signed)
 Your x-rays are all  Please take Mobic,  once daily as needed for pain - this in an antiinflammatory medicine (NSAID) and is similar to ibuprofen - many people feel that it is stronger than ibuprofen and it is easier to take since it is a smaller pill.  Please use this only for 1 week - if your pain persists, you will need to follow up with your doctor in the office for ongoing guidance and pain control.  Please take Robaxin , 500 mg up to 2 or 3 times a day as needed for muscle spasm, this is a muscle relaxer, it may cause generalized weakness, sleepiness and you should not drive or do important things while taking this medication.  This includes driving a vehicle or taking care of young children, these things should not be done while taking this medication.   Thank you for allowing us  to treat you in the emergency department today.  After reviewing your examination and potential testing that was done it appears that you are safe to go home.  I would like for you to follow-up with your doctor within the next several days, have them obtain your records and follow-up with them to review all potential tests and results from your visit.  If you should develop severe or worsening symptoms return to the emergency department immediately

## 2024-03-19 NOTE — ED Provider Notes (Signed)
  EMERGENCY DEPARTMENT AT [facility] Provider Note   CSN: [id number] Arrival date & time: 03/19/24  9080     Patient presents with: Back Pain The patient is a 53 year old female, she has a complaint of back pain, this is on the left side, started a couple of days ago, has been present but worse yesterday, worse with taking a breath, worse with bending or moving, worse with palpation around the left mid thoracic spine around the left ribs and onto the left anterior chest.  There is no rash in this location.  She did lift something heavy at work and thinks that that might have been the beginning.  She has no cough no fever no history of cardiac disease, she takes medications including methotrexate  and Plaquenil  folic acid  and rosuvastatin   Kandy Towery is a 53 y.o. female.    Back Pain      Prior to Admission medications  Medication Sig Start Date End Date Taking? Authorizing Provider  meloxicam (MOBIC) 15 MG tablet Take 1 tablet (15 mg total) by mouth daily for 14 days. 03/19/24 04/02/24 Yes Cleotilde Rogue, MD  methocarbamol  (ROBAXIN ) 500 MG tablet Take 1 tablet (500 mg total) by mouth 2 (two) times daily as needed for muscle spasms. 03/19/24  Yes Cleotilde Rogue, MD  cyclobenzaprine  (FLEXERIL ) 5 MG tablet TAKE 1 TABLET BY MOUTH AT BEDTIME AS NEEDED FOR MUSCLE SPASMS. 04/28/23   Jeannetta Lonni ORN, MD  folic acid  (FOLVITE ) 1 MG tablet Take 1 tablet (1 mg total) by mouth daily. 01/24/23   Rice, Lonni ORN, MD  hydroxychloroquine  (PLAQUENIL ) 200 MG tablet Take 1 tablet (200 mg total) by mouth daily. 03/24/23   Rice, Lonni ORN, MD  methotrexate  (RHEUMATREX) 2.5 MG tablet Take 8 tablets (20 mg total) by mouth once a week. Caution:Chemotherapy. Protect from light. 01/24/23   Rice, Lonni ORN, MD  rosuvastatin  (CRESTOR ) 40 MG tablet Take 1 tablet (40 mg total) by mouth daily. 02/25/23   Okey Vina GAILS, MD    Allergies: Patient has no known allergies.    Review  of Systems  Musculoskeletal:  Positive for back pain.  All other systems reviewed and are negative.   Updated Vital Signs BP 132/75   Pulse 99   Temp 97.9 F (36.6 C)   Resp 17   Ht 1.575 m (5' 2)   Wt 88.5 kg   SpO2 96%   BMI 35.67 kg/m   Physical Exam Vitals and nursing note reviewed.  Constitutional:      General: She is not in acute distress.    Appearance: She is well-developed.  HENT:     Head: Normocephalic and atraumatic.     Mouth/Throat:     Pharynx: No oropharyngeal exudate.  Eyes:     General: No scleral icterus.       Right eye: No discharge.        Left eye: No discharge.     Conjunctiva/sclera: Conjunctivae normal.     Pupils: Pupils are equal, round, and reactive to light.  Neck:     Thyroid: No thyromegaly.     Vascular: No JVD.  Cardiovascular:     Rate and Rhythm: Normal rate and regular rhythm.     Heart sounds: Normal heart sounds. No murmur heard.    No friction rub. No gallop.  Pulmonary:     Effort: Pulmonary effort is normal. No respiratory distress.     Breath sounds: Normal breath sounds. No wheezing or rales.  Abdominal:  General: Bowel sounds are normal. There is no distension.     Palpations: Abdomen is soft. There is no mass.     Tenderness: There is no abdominal tenderness.  Musculoskeletal:        General: Tenderness present. Normal range of motion.     Cervical back: Normal range of motion and neck supple.     Right lower leg: No edema.     Left lower leg: No edema.     Comments: Tenderness in the mid thoracic area around T4, no rash seen over the back the side or the chest.  There is tenderness across the ribs in that area, no tenderness of the abdominal wall  Lymphadenopathy:     Cervical: No cervical adenopathy.  Skin:    General: Skin is warm and dry.     Findings: No erythema or rash.  Neurological:     Mental Status: She is alert.     Coordination: Coordination normal.  Psychiatric:        Behavior: Behavior  normal.     (all labs ordered are listed, but only abnormal results are displayed) Labs Reviewed  BASIC METABOLIC PANEL WITH GFR - Abnormal; Notable for the following components:      Result Value   Glucose, Bld 102 (*)    All other components within normal limits  CBC  TROPONIN I (HIGH SENSITIVITY)    EKG: EKG Interpretation Date/Time:  Friday March 19 2024 09:51:40 EST Ventricular Rate:  98 PR Interval:  180 QRS Duration:  142 QT Interval:  406 QTC Calculation: 518 R Axis:   76  Text Interpretation: Normal sinus rhythm Non-specific intra-ventricular conduction block Cannot rule out Anterior infarct , age undetermined T wave abnormality, consider inferolateral ischemia Abnormal ECG When compared with ECG of 18-Feb-2023 15:25, PREVIOUS ECG IS PRESENT NO SIGNIFICANT CHANGE SINCE LAST TRACING YESTERDAY Confirmed by Cleotilde Rogue ([id number]) on 03/19/2024 9:53:58 AM  Radiology: [facility] Chest 2 View Result Date: 03/19/2024 CLINICAL DATA:  Chest pain radiating to left. EXAM: CHEST - 2 VIEW COMPARISON:  None Available. FINDINGS: The heart size and mediastinal contours are within normal limits. Both lungs are clear. The visualized skeletal structures are unremarkable. IMPRESSION: No active cardiopulmonary disease. Electronically Signed   By: Norleen DELENA Kil M.D.   On: 03/19/2024 12:48     Procedures   Medications Ordered in the ED  ketorolac  (TORADOL ) injection 60 mg (60 mg Intramuscular Given 03/19/24 1058)                                    Medical Decision Making Amount and/or Complexity of Data Reviewed Labs: ordered. Radiology: ordered.  Risk Prescription drug management.   Patient is ambulatory without difficulty, her EKG is essentially unchanged since her last EKG.  She is not having cardiac disease, she does not have exertional symptoms, she has more of a pain with breathing like a pleuritic type pain although it is also reproducible to palpation.  There is no rash to  suggest that this is shingles.  Her vital signs are unremarkable without hypertension fever tachycardia or hypoxia.  X-ray will be obtained.   Radiology Imaging: I personally viewed the images of the ordered radiographic studies and find no signs of acute abnormalities of the chest including ribs or lungs I agree with the radiologist interpretation as well  I have discussed with the patient at the bedside the results, and the  meaning of these results.  They have had opportunity to ask questions,  expressed their understanding to the need for follow-up with primary care physician  Home with Robaxin  and Mobic     Final diagnoses:  Acute left-sided thoracic back pain    ED Discharge Orders          Ordered    methocarbamol  (ROBAXIN ) 500 MG tablet  2 times daily PRN        03/19/24 1322    meloxicam (MOBIC) 15 MG tablet  Daily        03/19/24 1322               Cleotilde Rogue, MD 03/19/24 1323

## 2024-03-22 ENCOUNTER — Emergency Department (HOSPITAL_COMMUNITY)
Admission: EM | Admit: 2024-03-22 | Discharge: 2024-03-22 | Disposition: A | Discharge status: Home / Self Care | Payer: Self-pay

## 2024-03-22 ENCOUNTER — Encounter (HOSPITAL_COMMUNITY): Payer: Self-pay

## 2024-03-22 ENCOUNTER — Other Ambulatory Visit: Payer: Self-pay

## 2024-03-22 DIAGNOSIS — K047 Periapical abscess without sinus: Secondary | ICD-10-CM

## 2024-03-22 MED ORDER — AMOXICILLIN-POT CLAVULANATE 875-125 MG PO TABS: 1.0000 | ORAL_TABLET | Freq: Two times a day (BID) | ORAL | 0 refills | Status: AC

## 2024-03-22 NOTE — ED Triage Notes (Signed)
 Pt states she thinks he has a dental abscess on left side starting Saturday, facial swelling and pain when chewing. Pt denies trouble swallowing.

## 2024-03-22 NOTE — Discharge Instructions (Addendum)
 Please take the Augmentin  as prescribed.  You may also take the meloxicam  that was prescribed a few days ago for your pain which should also help with swelling.  Please follow-up with a dentist.  A resource guide is provided.  Return to the ER for worsening symptoms.

## 2024-03-22 NOTE — ED Provider Notes (Signed)
 [place] EMERGENCY DEPARTMENT AT [facility] Provider Note   CSN: [id number] Arrival date & time: 03/22/24  9153     Patient presents with: Oral Swelling   Rachel Meza is a 53 y.o. female.   53 year old female with past medical history of hyperlipidemia and rheumatoid arthritis on methotrexate  and Plaquenil  presenting to the emergency department today with right-sided dental pain.  The patient states that she has been noting some right upper dental pain over the past few days.  Denies any difficulty breathing or swallowing.  She reports that she has noticed some facial swelling.  Does not see a dentist.  She came to the ER today for further evaluation regarding this due to these ongoing symptoms.  She denies any fevers.        Prior to Admission medications  Medication Sig Start Date End Date Taking? Authorizing Provider  amoxicillin -clavulanate (AUGMENTIN ) 875-125 MG tablet Take 1 tablet by mouth every 12 (twelve) hours. 03/22/24  Yes Ula Prentice SAUNDERS, MD  cyclobenzaprine  (FLEXERIL ) 5 MG tablet TAKE 1 TABLET BY MOUTH AT BEDTIME AS NEEDED FOR MUSCLE SPASMS. 04/28/23   Jeannetta Lonni ORN, MD  folic acid  (FOLVITE ) 1 MG tablet Take 1 tablet (1 mg total) by mouth daily. 01/24/23   Rice, Lonni ORN, MD  hydroxychloroquine  (PLAQUENIL ) 200 MG tablet Take 1 tablet (200 mg total) by mouth daily. 03/24/23   Rice, Lonni ORN, MD  meloxicam  (MOBIC ) 15 MG tablet Take 1 tablet (15 mg total) by mouth daily for 14 days. 03/19/24 04/02/24  Cleotilde Rogue, MD  methocarbamol  (ROBAXIN ) 500 MG tablet Take 1 tablet (500 mg total) by mouth 2 (two) times daily as needed for muscle spasms. 03/19/24   Cleotilde Rogue, MD  methotrexate  (RHEUMATREX) 2.5 MG tablet Take 8 tablets (20 mg total) by mouth once a week. Caution:Chemotherapy. Protect from light. 01/24/23   Rice, Lonni ORN, MD  rosuvastatin  (CRESTOR ) 40 MG tablet Take 1 tablet (40 mg total) by mouth daily. 02/25/23   Okey Vina GAILS, MD     Allergies: Patient has no known allergies.    Review of Systems  HENT:  Positive for dental problem.   All other systems reviewed and are negative.   Updated Vital Signs BP (!) 146/83 (BP Location: Right Arm)   Pulse (!) 101   Temp 98.1 F (36.7 C)   Resp 18   SpO2 100%   Physical Exam Vitals and nursing note reviewed.   Gen: NAD Eyes: PERRL, EOMI HEENT: mild R sided facial swelling, there is mild gingival erythema around teeth 3 and 4 with no fluctuant area noted Neck: trachea midline Resp: clear to auscultation bilaterally Card: RRR, no murmurs, rubs, or gallops Abd: nontender, nondistended Extremities: no calf tenderness, no edema Vascular: 2+ radial pulses bilaterally, 2+ DP pulses bilaterally Neuro: Cranial nerves intact Skin: no rashes Psyc: acting appropriately   (all labs ordered are listed, but only abnormal results are displayed) Labs Reviewed - No data to display  EKG: None  Radiology: No results found.   Procedures   Medications Ordered in the ED - No data to display                                  Medical Decision Making 53 year old female with past medical history of rheumatoid arthritis and hypertension presenting to the emergency department today with symptoms consistent with dental infection.  Patient is well-appearing here.  Does not  have any findings on exam concerning for deep space soft tissue infection at this time.  I will treat the patient here with Augmentin .  She will be given dental resources and follow-up with return precautions.        Final diagnoses:  Dental infection    ED Discharge Orders          Ordered    amoxicillin -clavulanate (AUGMENTIN ) 875-125 MG tablet  Every 12 hours        03/22/24 1206               Ula Prentice SAUNDERS, MD 03/22/24 1207
